# Patient Record
Sex: Female | Born: 2000 | Race: White | Hispanic: No | Marital: Single | Smoking: Never smoker
Health system: Southern US, Community
[De-identification: ages and names within clinical notes are randomized; demographics above are authoritative.]

## PROBLEM LIST (undated history)

## (undated) ENCOUNTER — Emergency Department (HOSPITAL_COMMUNITY): Payer: Self-pay

## (undated) DIAGNOSIS — B349 Viral infection, unspecified: Secondary | ICD-10-CM

## (undated) DIAGNOSIS — F17200 Nicotine dependence, unspecified, uncomplicated: Secondary | ICD-10-CM

## (undated) DIAGNOSIS — A749 Chlamydial infection, unspecified: Secondary | ICD-10-CM

## (undated) DIAGNOSIS — F99 Mental disorder, not otherwise specified: Secondary | ICD-10-CM

## (undated) DIAGNOSIS — A64 Unspecified sexually transmitted disease: Secondary | ICD-10-CM

## (undated) DIAGNOSIS — J302 Other seasonal allergic rhinitis: Secondary | ICD-10-CM

## (undated) DIAGNOSIS — F419 Anxiety disorder, unspecified: Secondary | ICD-10-CM

---
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
## 2019-01-10 ENCOUNTER — Encounter (HOSPITAL_COMMUNITY): Payer: Self-pay

## 2019-01-10 ENCOUNTER — Other Ambulatory Visit: Payer: Self-pay

## 2019-01-10 ENCOUNTER — Inpatient Hospital Stay (HOSPITAL_COMMUNITY): Payer: MEDICAID | Admitting: OBSTETRICS/GYNECOLOGY

## 2019-01-10 ENCOUNTER — Encounter (HOSPITAL_COMMUNITY): Payer: MEDICAID | Admitting: Student in an Organized Health Care Education/Training Program

## 2019-01-10 ENCOUNTER — Inpatient Hospital Stay
Admission: AD | Admit: 2019-01-10 | Discharge: 2019-01-11 | DRG: 806 | Disposition: A | Discharge status: Home / Self Care | Payer: MEDICAID | Attending: OBSTETRICS/GYNECOLOGY | Admitting: OBSTETRICS/GYNECOLOGY

## 2019-01-10 ENCOUNTER — Encounter (HOSPITAL_COMMUNITY): Payer: Self-pay | Admitting: Student in an Organized Health Care Education/Training Program

## 2019-01-10 DIAGNOSIS — O99334 Smoking (tobacco) complicating childbirth: Secondary | ICD-10-CM

## 2019-01-10 DIAGNOSIS — F17210 Nicotine dependence, cigarettes, uncomplicated: Secondary | ICD-10-CM | POA: Diagnosis present

## 2019-01-10 DIAGNOSIS — O99344 Other mental disorders complicating childbirth: Secondary | ICD-10-CM | POA: Diagnosis present

## 2019-01-10 DIAGNOSIS — O99343 Other mental disorders complicating pregnancy, third trimester: Secondary | ICD-10-CM

## 2019-01-10 DIAGNOSIS — O99333 Smoking (tobacco) complicating pregnancy, third trimester: Secondary | ICD-10-CM

## 2019-01-10 DIAGNOSIS — F419 Anxiety disorder, unspecified: Secondary | ICD-10-CM | POA: Diagnosis present

## 2019-01-10 DIAGNOSIS — O9882 Other maternal infectious and parasitic diseases complicating childbirth: Secondary | ICD-10-CM

## 2019-01-10 DIAGNOSIS — A568 Sexually transmitted chlamydial infection of other sites: Secondary | ICD-10-CM

## 2019-01-10 DIAGNOSIS — A749 Chlamydial infection, unspecified: Secondary | ICD-10-CM | POA: Diagnosis present

## 2019-01-10 DIAGNOSIS — Z3A39 39 weeks gestation of pregnancy: Secondary | ICD-10-CM

## 2019-01-10 DIAGNOSIS — N644 Mastodynia: Secondary | ICD-10-CM | POA: Diagnosis not present

## 2019-01-10 DIAGNOSIS — Z23 Encounter for immunization: Secondary | ICD-10-CM

## 2019-01-10 DIAGNOSIS — Z3490 Encounter for supervision of normal pregnancy, unspecified, unspecified trimester: Secondary | ICD-10-CM | POA: Diagnosis present

## 2019-01-10 HISTORY — DX: Anxiety disorder, unspecified: F41.9

## 2019-01-10 HISTORY — DX: Chlamydial infection, unspecified: A74.9

## 2019-01-10 HISTORY — DX: Viral infection, unspecified: B34.9

## 2019-01-10 HISTORY — DX: Mental disorder, not otherwise specified: F99

## 2019-01-10 HISTORY — DX: Unspecified sexually transmitted disease: A64

## 2019-01-10 HISTORY — DX: Nicotine dependence, unspecified, uncomplicated: F17.200

## 2019-01-10 HISTORY — DX: Other seasonal allergic rhinitis: J30.2

## 2019-01-10 LAB — DRUG SCREEN, NO CONFIRMATION, URINE
AMPHETAMINES URINE: NEGATIVE
BARBITURATES URINE: NEGATIVE
BENZODIAZEPINES URINE: NEGATIVE
BUPRENORPHINE URINE: NEGATIVE
CANNABINOIDS URINE: NEGATIVE
COCAINE METABOLITES URINE: NEGATIVE
CREATININE RANDOM URINE: 57 mg/dL
ECSTASY/MDMA URINE: NEGATIVE
FENTANYL, RANDOM URINE: NEGATIVE
METHADONE URINE: NEGATIVE
OPIATES URINE (LOW CUTOFF): NEGATIVE
OXIDANT-ADULTERATION: NEGATIVE
OXYCODONE URINE: NEGATIVE
PH-ADULTERATION: 7.1 (ref 4.5–9.0)
SPECIFIC GRAVITY-ADULTERATION: 1.012 g/mL (ref 1.005–1.030)

## 2019-01-10 LAB — CBC WITH DIFF
BASOPHIL #: <0.1 10*3/uL (ref <=0.20)
BASOPHIL %: 0 %
EOSINOPHIL #: <0.1 10*3/uL (ref <=0.50)
EOSINOPHIL %: 0 %
HCT: 36.9 % (ref 34.8–46.0)
HGB: 12.2 g/dL (ref 11.5–16.0)
IMMATURE GRANULOCYTE #: <0.1 10*3/uL (ref <0.10)
IMMATURE GRANULOCYTE %: 0 % (ref 0–1)
LYMPHOCYTE #: 2.45 10*3/uL (ref 1.00–4.80)
LYMPHOCYTE %: 18 %
MCH: 29.4 pg (ref 26.0–32.0)
MCHC: 33.1 g/dL (ref 31.0–35.5)
MCV: 88.9 fL (ref 78.0–100.0)
MONOCYTE #: 0.84 10*3/uL (ref 0.20–1.10)
MONOCYTE %: 6 %
MPV: 11.8 fL (ref 8.7–12.5)
NEUTROPHIL #: 9.88 10*3/uL — ABNORMAL HIGH (ref 1.50–7.70)
NEUTROPHIL %: 76 %
PLATELETS: 219 10*3/uL (ref 150–400)
RBC: 4.15 10*6/uL (ref 3.85–5.22)
RDW-CV: 14.4 % (ref 11.5–15.5)
WBC: 13.3 10*3/uL — ABNORMAL HIGH (ref 3.7–11.0)

## 2019-01-10 LAB — CHLAMYDIA AND NEISSERIA DNA BY PCR
CHLAMYDIA TRACHOMATIS PCR: DETECTED — AB
NEISSERIA GONORRHOEAE PCR: NOT DETECTED

## 2019-01-10 LAB — TYPE AND SCREEN
ABO/RH(D): A POS
ANTIBODY SCREEN: NEGATIVE

## 2019-01-10 LAB — H & H
HCT: 36.9 % (ref 34.8–46.0)
HGB: 12.2 g/dL (ref 11.5–16.0)

## 2019-01-10 MED ORDER — OXYTOCIN 30 UNIT/500 ML IN 0.9 % SODIUM CHLORIDE INTRAVENOUS
334.0000 m[IU]/min | INTRAVENOUS | Status: AC
Start: 2019-01-10 — End: 2019-01-10
  Administered 2019-01-10: 95 m[IU]/min via INTRAVENOUS

## 2019-01-10 MED ORDER — OXYTOCIN 30 UNIT/500 ML IN 0.9 % SODIUM CHLORIDE INTRAVENOUS
INTRAVENOUS | Status: AC
Start: 2019-01-10 — End: 2019-01-10
  Administered 2019-01-10: 30 [IU] via INTRAVENOUS
  Filled 2019-01-10: qty 500

## 2019-01-10 MED ORDER — LIDOCAINE HCL 10 MG/ML (1 %) INJECTION SOLUTION
Freq: Once | INTRAMUSCULAR | Status: DC | PRN
Start: 2019-01-10 — End: 2019-01-11
  Administered 2019-01-10: 3 mL

## 2019-01-10 MED ORDER — ACETAMINOPHEN 325 MG TABLET
650.0000 mg | ORAL_TABLET | ORAL | Status: DC | PRN
Start: 2019-01-10 — End: 2019-01-11

## 2019-01-10 MED ORDER — SODIUM CHLORIDE 0.9 % (FLUSH) INJECTION SYRINGE
2.0000 mL | INJECTION | Freq: Three times a day (TID) | INTRAMUSCULAR | Status: DC
Start: 2019-01-10 — End: 2019-01-10

## 2019-01-10 MED ORDER — MINERAL OIL ORAL
TOPICAL_OIL | ORAL | Status: AC
Start: 2019-01-10 — End: 2019-01-10
  Administered 2019-01-10: 30 mL
  Filled 2019-01-10: qty 60

## 2019-01-10 MED ORDER — ROPIVACAINE (PF) 2 MG/ML (0.2 %) INJECTION SOLUTION
Freq: Once | INTRAMUSCULAR | Status: DC | PRN
Start: 2019-01-10 — End: 2019-01-11
  Administered 2019-01-10: 5 mL

## 2019-01-10 MED ORDER — AZITHROMYCIN 500 MG INTRAVENOUS SOLUTION
500.0000 mg | INTRAVENOUS | Status: DC
Start: 2019-01-10 — End: 2019-01-10
  Administered 2019-01-10: 14:00:00
  Filled 2019-01-10: qty 5

## 2019-01-10 MED ORDER — IBUPROFEN 600 MG TABLET
600.0000 mg | ORAL_TABLET | Freq: Four times a day (QID) | ORAL | Status: DC
Start: 2019-01-10 — End: 2019-01-11
  Administered 2019-01-10 – 2019-01-11 (×2): 0 mg via ORAL
  Administered 2019-01-11: 18:00:00
  Administered 2019-01-11 (×2): 0 mg via ORAL

## 2019-01-10 MED ORDER — SODIUM CHLORIDE 0.9 % (FLUSH) INJECTION SYRINGE
2.00 mL | INJECTION | Freq: Three times a day (TID) | INTRAMUSCULAR | Status: DC
Start: 2019-01-10 — End: 2019-01-11
  Administered 2019-01-10 – 2019-01-11 (×4): 0 mL

## 2019-01-10 MED ORDER — FENTANYL 2MCG/ML + ROPIVACAINE 0.2% IN NS 250ML (TOT VOL) PCEA INFUSION
INJECTION | Status: DC
Start: 2019-01-10 — End: 2019-01-10
  Administered 2019-01-10: 10 mL/h via EPIDURAL
  Administered 2019-01-10: 16:00:00 0 mL/h via EPIDURAL
  Filled 2019-01-10: qty 250

## 2019-01-10 MED ORDER — SODIUM CHLORIDE 0.9 % (FLUSH) INJECTION SYRINGE
2.0000 mL | INJECTION | INTRAMUSCULAR | Status: DC | PRN
Start: 2019-01-10 — End: 2019-01-10

## 2019-01-10 MED ORDER — LACTATED RINGERS INTRAVENOUS SOLUTION
INTRAVENOUS | Status: DC
Start: 2019-01-10 — End: 2019-01-11
  Administered 2019-01-10: 10:00:00 via INTRAVENOUS

## 2019-01-10 MED ORDER — DOCUSATE SODIUM 100 MG CAPSULE
100.0000 mg | ORAL_CAPSULE | Freq: Two times a day (BID) | ORAL | Status: DC | PRN
Start: 2019-01-10 — End: 2019-01-11

## 2019-01-10 MED ORDER — SODIUM CHLORIDE 0.9 % (FLUSH) INJECTION SYRINGE
2.00 mL | INJECTION | INTRAMUSCULAR | Status: DC | PRN
Start: 2019-01-10 — End: 2019-01-11

## 2019-01-10 MED ORDER — LIDOCAINE-EPINEPHRINE (PF) 1.5 %-1:200,000 INJECTION SOLUTION
Freq: Once | INTRAMUSCULAR | Status: DC | PRN
Start: 2019-01-10 — End: 2019-01-11
  Administered 2019-01-10: 3 mL via EPIDURAL

## 2019-01-10 NOTE — Anesthesia Procedure Notes (Signed)
Lumbar Epidural   Performed by:    Performing Provider:  Philmore Pali, MD  Authorizing Provider:  Tollie Pizza, MD    Indication: pain relief in labor and delivery        Pt location: At bedside  Technique: ( See MAR for exact doses)  Technique/Approach: midline        Needle Level: L3-4  Sterile Skin Prep : aseptic technique, cap, hand hygiene performed, sterile gloves, mask, sterilely prepped and draped and sterile field established     Site verified, H&P updated and consent obtained, Patient monitors applied, Timeout performed, Emergency drugs and equipment available, Patient positioned and anesthesia consent given   Patient position: sitting   Skin local: Lidocaine 1%   Needle/Catheter: Needle type: Hustead   Needle Gauge: 18 G  Needle length: 3.5 in  Epidural Injection Technique LOR saline  Needle insertion depth 7 cm Catheter length in space: 5 cm   Catheter at skin depth: 12 cm  Epidural catheter location: lumbar (1-5)  Number of attempts: 1  Events: neg aspiration and no complications,         Dosing:       lidocaine 1.5% with epinephrine 1:200,000       Negative test - not INTRAVENOUS  and Negative test - not Subarachnoid   Injection made incrementally with aspirations every 42mL .  Catheter: secured and sterile dressing applied         Patient response: comfortable  NOTES: Philmore Pali, MD  Department of Anesthesiology  [phone]

## 2019-01-10 NOTE — Nurses Notes (Signed)
Patient transferred to room 603 via bed.

## 2019-01-10 NOTE — Progress Notes (Signed)
[facility]      Labor Progress Note    PATIENT:  Erika Greene   MRN:  [id number]   DATE OF SERVICE:  01/10/2019, 11:42     S:  Patient resting comfortably in bed.     O:    Filed Vitals:    01/10/19 1110 01/10/19 1120 01/10/19 1132 01/10/19 1140   BP: 138/82 124/68 118/66 122/72   Pulse: 90 75 (!) 102 100   Temp:       SpO2:           SVE:  8/100/0    FHT:  145 baseline, moderate variability, + accels, - decels  Toco:  Q2-49m    A/P:  18 y.o. G1P0 @ [redacted]w[redacted]d    1.  Labor   SVE changed from prior exam - 8/100/0   EFM Cat 1, reassuring; ctx q2-44m   Analgesia:    -epidural in place    GBS neg    Anticipate SVD.     Carolanne Grumbling, DO 01/10/2019 12:29  PGY-1  [facility]  Department of Obstetrics & Gynecology            I saw and examined the patient.  I reviewed the resident's note.  I agree with the findings and plan of care as documented in the resident's note.  Any exceptions/additions are edited/noted.    Holley Bouche, MD

## 2019-01-10 NOTE — Anesthesia Preprocedure Evaluation (Addendum)
ANESTHESIA PRE-OP EVALUATION  Planned Procedure: ANES - LABOR ANALGESIA  Review of Systems     anesthesia history negative               Pulmonary   Smoking history,   Cardiovascular    No peripheral edema,  Exercise Tolerance: > or = 4 METS        GI/Hepatic/Renal   negative GI/hepatic/renal ROS,      Endo/Other   neg endo/other ROS,        Neuro/Psych/MS    anxiety     Cancer  negative hematology/oncology ROS,                    Physical Assessment      Airway       Mallampati: II    TM distance: >3 FB    Neck ROM: full  Mouth Opening: good.      No endotracheal tube present  No Tracheostomy present    Dental       Dentition intact             Pulmonary    Breath sounds clear to auscultation  (-) no rhonchi, no decreased breath sounds, no wheezes, no rales and no stridor     Cardiovascular    Rhythm: regular  Rate: Normal  (-) no friction rub, carotid bruit is not present, no peripheral edema and no murmur     Other findings            Plan  ASA 2     Planned anesthesia type: epidural                  Anesthesia issues/risks discussed are: Spinal Headache, Failure of Block, Cardiac Events/MI, Local Anesthetic Systemic Toxicity and High Neuraxial Block.  Anesthetic plan and risks discussed with patient.            NPO Status: Full stomach precautions.                        Results for EVALIE, HARGRAVES (MRN [id number]) as of 01/10/2019 10:34   Ref. Range 01/10/2019 10:05   WBC Latest Ref Range: 3.7 - 11.0 x10^3/uL 13.3 (H)   HGB Latest Ref Range: 11.5 - 16.0 g/dL 12.2   HCT Latest Ref Range: 34.8 - 46.0 % 36.9   PLATELET COUNT Latest Ref Range: 150 - 400 x10^3/uL 219     Risks and benefits of epidural discussed including headache, nerve injuries, hypotension, and failure of block. Patient understands, all questions answered, will proceed with epidural.     Philmore Pali, MD  01/10/2019, 10:35        I saw and examined the patient.  I reviewed the resident's note.  I agree with the findings and plan of care as documented in  the resident's note.  Any exceptions/additions are edited/noted.    97DZ G1 with uncomplicated pregnancy, h/o anxiety, recently moved from [place] presenting in labor, requesting epidural for labor pain relief.  NPO appropriate.  ASA2 for epidural labor analgesia with PCEA.    Tollie Pizza, MD

## 2019-01-10 NOTE — H&P (Signed)
[facility]      Obstetrics History and Physical    PATIENT:  Erika Greene   MRN:  [id number]   DATE OF SERVICE:  01/10/2019, 10:35      PRIMARY OB:  Jorja Loa, Rod MD     CC:  Contractions 2-3 mins apart     HPI:  Erika Greene is a 18 y.o. G1P0 at 42w0dper paper record consistent with LMP who presents to labor and delivery for contractions 2-3 minutes apart.  Pt stated yesterday she went to her OBGYN's office and she was 4 cm dilated.  She woke at 7 am this morning with pain in her back and the pain transitioned to her pelvic area.  She states the pain feels like pressure, squeezing and contractions and are spaced out 2-3 minutes.  She has experienced no gush of fluid or blood from vagina. Her paper records state the baby is cephalic, which is consistent with physical exam. [place] from her early pregnancy.  She follows with Dr HJorja Loasince May.  She was diagnosed with chlamydia in early pregnancy, it is unknown if she completed a full course of antibiotic therapy for that infection - GC/CC pending.  No evidence of HTN or DM per records. Rubella and Hep B non-immune.  PT has smoked tobacco throughout duration of pregnancy. Patient has migraines without aura anxiety and has not taken medications for many years. She has been taking her iron and prenatal vitamins, she has not been taking her prescribed mag. She desires an epidural for labor.  Father of baby will come to L&D floor after working today at 6 pm.     ROD:  Dating Summary     No data available           OBHx:  Lab Results   Component Value Date    HGB 12.2 01/10/2019    HGB 12.2 01/10/2019    HCT 36.9 01/10/2019    HCT 36.9 01/10/2019     Rubella:  Non-immune    HIV:  negative    VDRL:  negative     Hep B SAG:  Non-immune     GC:  Pending; chlamydia early preganncy     GBS:  -    OB History   Gravida Para Term Preterm AB Living   1             SAB TAB Ectopic  Multiple Live Births                  # Outcome Date GA Lbr Len/2nd Weight Sex Delivery Anes PTL Lv   1 Current                PMHx:  Past Medical History:   Diagnosis Date   . Anxiety    . Chlamydia    . Psychiatric problem    . STD (sexually transmitted disease)    . Tobacco dependence    . Viral illness       Anxiety - not on medication  Tobacco use disorder; smoked throughout pregnancy     Past Medical History was reviewed and is negative for diabetes, htn      PSHx:  Past Surgical History was reviewed and is negative for all surgeries        FamHx:   Denies FH of BD, MR, SZs or bleeding/clotting disorders  Family history of heart disease -  DM2, MI  Family Medical History:     Problem Relation (Age of Onset)    Heart Disease Father              SocHx:  Denies drug use  Currently smokes   Social History     Tobacco Use   . Smoking status: Current Every Day Smoker     Types: Cigarettes   Substance Use Topics   . Alcohol use: Not Currently     Frequency: Never   . Drug use: Not Currently        CURRENT MEDS:   Medications Prior to Admission     Prescriptions    ferrous sulfate (FERATAB) 324 mg (65 mg iron) Oral Tablet, Delayed Release (E.C.)    Take 324 mg by mouth    prenatal vitamin-iron-folate Tablet    Take 1 Tab by mouth Once a day           ALLERGIES:  Nutritional supplement-fiber   Eggs - throat swelling     REVIEW OF SYSTEMS: Other than ROS in the HPI, all other systems were negative.    PHYSICAL EXAMINATION:   Filed Vitals:    01/10/19 0920 01/10/19 0934   BP: 130/89    Pulse: 85    Temp:  36.4 C (97.5 F)     Vitals: stable  GEN:  moderately obese, appears stated age and severe distress, normal mood and affect  HEENT:  EOMI, CN II-XII grossly intact  CV:  regular rate and rhythm, S1, S2 normal, no murmur, click, rub or gallop  RESP:  Clear to auscultation bilaterally.   ABD:  Soft, non-tender, Bowel sounds normal; gravid  EXT:  No cyanosis or edema  NEURO:  2+ reflexes bilateral patellar  SKIN:  No  apparent lesions, no rash    SVE:  5/90/-1 at 10:00  SSE:  Bulging sac appreciated   PRESENTATION:  Cephalic by ultrasound  EFW:   Unknown     FHRT:  135 baseline, moderate variability, + accels, - decels  TOCO:  Contractions present 2-3 mins     ASSESSMENT/PLAN:  18 y.o. G1P0 at [redacted]W[redacted]D by LMP per paper chart     PNC   PNL WNL   Continue PNV    1.  G1P0 [redacted]W[redacted]D w Contractions at 5/90/-1 at 10:00 AM   CBC ordered; desires epidural at this time   IV access, LR 75 cc/hr   Vitals   Fetal monitoring; continuous    Expectant management for SVD   GBS negative per paper record    urine drug screen pending    2.  Chlamydia in Early Pregnancy    Repeat GC/CC pending   Unreliable records and prenatal care; moved from [place] with no records    3. Anxiety   2 week follow up mood check    4. Rubella non-immune   Will need MMR    5. HBV non-immune   Consider Hep B series post delivery      6. Tobacco use in Pregnancy   Smoking cessation coaching        Jacklynn Lewis, MD  PGY-1        I saw and examined the patient.  I reviewed the resident's note.  I agree with the findings and plan of care as documented in the resident's note.  Any exceptions/additions are edited/noted.    Holley Bouche, MD

## 2019-01-10 NOTE — Care Plan (Signed)
Erika Greene is 18yo G1 now P1 that had a SVD of a viable baby boy that is rooming in with her. Labor, delivery and recovery uncomplicated. Breastfeeding her baby boy. Positive bonding observed.

## 2019-01-10 NOTE — Nurses Notes (Signed)
Recovery completed, assisted up to bathroom with help of sara steady, voided then pericare instructions/demo given. Ambulated back to bed with steady gait. Instructed to not get up with out assistance, verbalized understanding

## 2019-01-10 NOTE — Nurses Notes (Signed)
Report to Lebron Conners, RN

## 2019-01-11 DIAGNOSIS — O99335 Smoking (tobacco) complicating the puerperium: Secondary | ICD-10-CM

## 2019-01-11 DIAGNOSIS — O99345 Other mental disorders complicating the puerperium: Secondary | ICD-10-CM

## 2019-01-11 MED ORDER — AZITHROMYCIN 500 MG TABLET
1000.0000 mg | ORAL_TABLET | Freq: Every day | ORAL | 0 refills | Status: DC
Start: 2019-01-11 — End: 2019-01-11

## 2019-01-11 MED ORDER — AZITHROMYCIN 500 MG TABLET
1000.0000 mg | ORAL_TABLET | Freq: Every day | ORAL | 0 refills | Status: AC
Start: 2019-01-11 — End: ?

## 2019-01-11 MED ORDER — PSYLLIUM ORAL PACKET - CUSTOM
1.00 | PACK | Freq: Two times a day (BID) | 0 refills | Status: AC
Start: 2019-01-11 — End: 2019-02-10

## 2019-01-11 MED ORDER — MEASLES,MUMPS,RUBELLA VACCINE LIVE(PF)1,000-12,500TCID50/0.5 ML SUBCUT
0.5000 mL | Freq: Once | SUBCUTANEOUS | Status: AC
Start: 2019-01-11 — End: 2019-01-11
  Administered 2019-01-11: 0.5 mL via SUBCUTANEOUS
  Filled 2019-01-11: qty 0.5

## 2019-01-11 MED ORDER — MEASLES,MUMPS,RUBELLA VACCINE LIVE(PF)1,000-12,500TCID50/0.5 ML SUBCUT
0.5000 mL | Freq: Once | SUBCUTANEOUS | 0 refills | Status: AC
Start: 2019-01-11 — End: 2019-01-11

## 2019-01-11 MED ORDER — AZITHROMYCIN 250 MG TABLET
1000.0000 mg | ORAL_TABLET | Freq: Once | ORAL | Status: AC
Start: 2019-01-11 — End: 2019-01-11
  Administered 2019-01-11: 08:00:00 1000 mg via ORAL
  Filled 2019-01-11: qty 4
  Filled 2019-01-11: qty 2

## 2019-01-11 MED ORDER — IBUPROFEN 600 MG TABLET
600.00 mg | ORAL_TABLET | Freq: Four times a day (QID) | ORAL | 2 refills | Status: AC
Start: 2019-01-11 — End: ?

## 2019-01-11 MED ORDER — FERROUS SULFATE 324 MG (65 MG IRON) TABLET,DELAYED RELEASE
324.00 mg | DELAYED_RELEASE_TABLET | ORAL | 2 refills | Status: AC
Start: 2019-01-11 — End: ?

## 2019-01-11 MED ORDER — AZITHROMYCIN 250 MG TABLET
1000.0000 mg | ORAL_TABLET | Freq: Every day | ORAL | Status: DC
Start: 2019-01-11 — End: 2019-01-11

## 2019-01-11 NOTE — Care Management Notes (Signed)
[facility] Management Initial Evaluation    Patient Name: Erika Greene  Date of Birth: March 08, 2001  Sex: female  Date/Time of Admission: 01/10/2019  9:13 AM  Room/Bed: 620/A  Payor: /   Primary Care Providers:  Erlene Quan, MD, MD (OB/GYN)    Pharmacy Info:   [facility], [place]    [place] [address]    Phone: [phone] Fax: [phone]    Not a 24 hour pharmacy; exact hours not known.        Emergency Contact Info:   No emergency contact information on file.    History:   Erika Greene is a 18 y.o., female, admitted for labor.     Height/Weight: 160 cm ('5\' 3"'$ ) / 86.6 kg (191 lb)     LOS: 1 day   Admitting Diagnosis: Pre-birth visit for expectant mother [Z34.90]    Assessment:      01/11/19 1300   Assessment Details   Assessment Type Admission   Date of Care Management Update 01/11/19   Date of Next DCP Update 01/14/19   Readmission   Is this a readmission? No   Care Management Plan   Discharge Planning Status initial meeting   Projected Discharge Date 01/11/19   Discharge plan discussed with: Patient   Discharge Needs Assessment   Outpatient/Agency/Support Group Needs   (none)   Equipment Currently Used at Home breast pump   Equipment Needed After Discharge none   Discharge Facility/Level of Care Needs Home (Patient/Family Member/other)(code 1)   Transportation Available car;family or friend will provide   Referral Information   Admission Type inpatient   Address Verified verified-no changes   Arrived From home or self-care   Insurance Verified verified-no change   ADVANCE DIRECTIVES   Does the Patient have an Advance Directive? No, Information Offered and Refused   Patient Requests Assistance in Having Advance Directive Notarized. N/A   LAY CAREGIVER    Appointed Lay Caregiver? I Decline   Employment/Financial   Patient has Prescription Coverage?  Yes        Name of Insurance Coverage for Medications [facility]      Financial Concerns none   Living Environment   Select an age group to open "lives with" row.  Adult   Lives With significant other;other (see comments)   Living Arrangements house   Able to Return to Prior Arrangements yes   Living Arrangement Comments resides with s/o and his grandmother   Home Safety   Home Assessment: No Problems Identified   Home Accessibility no concerns     Pt is an 18 year old G1 now P1 delivered at 83w2dgestation. Viable infant boy delivered via SVD on 01/10/19 at 1544, weighing 7 lbs 1.1 oz with Apgars of 8 and 9. Pt is bottle feeding and pumping and infant is rooming in. Infant to be named Erika Greene.   MSW met with pt at bedside to complete assessment. Pt resides with s/o- DG[place]y Clementand his grandmother [place] [address] [place]([phone]. 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 Pt reports having all necessary items for infant such as crib, car seat, etc. Car seat to be brought to room. Pt has breast pump at home that she obtained through insurance. [facility]  [facility] to provide transportation at d/c. Pt report she and s/o moved here from [place] to be closer to family. Her family resides in [place]. No d/c needs identified at this time. Anticipate d/c to home. Will follow.       Discharge Plan:  Home (Patient/Family Member/other) (code 1)  Pt s/p SVD. Pt likely to d/c 24 hours after delivery if there are no complications.     The patient will continue to be evaluated for developing discharge needs.     Case Manager: Lanae Crumbly, [place]  Phone: [phone]

## 2019-01-11 NOTE — Progress Notes (Signed)
[facility]      Postpartum Vaginal Delivery Progress Note    PATIENT:  Erika Greene   MRN:  [id number]   DATE OF SERVICE:  01/11/2019, 06:06      SUBJECTIVE:  Erika Greene is a 18 y.o. G1P1001 PPD #1 s/p SVD at 1544 over intact perineum. Pt doing well with no complaints voiced at this time.  Ambulating and voiding without difficulty.  Tolerating regular  diet with no N/V.  - flatus, - BM.  Reports 7/10 cramping pain but feels it is insignificant because she needs to take care of her baby and make sure he doesn't cry. Denies CP, SOB, dizziness or calf tenderness.  Minimal lochia reported. Breast feeding but reports significant pain with latching.  Circ desired.      OBJECTIVE:   Filed Vitals:    01/10/19 1807 01/10/19 1808 01/11/19 0051 01/11/19 0418   BP: 131/77  (!) 143/88 (!) 145/73   Pulse: 91      Resp:  _0 Temp:  36.4 C (97.5 F) 37.1 C (98.8 F) 37.2 C (99 F)   SpO2:    98%       GENERAL:  NAD, well-appearing  CV:  RRR, no m/r/g  RESP:  CTAB  ABD:  Soft, non-distended, appropriately tender  FUNDUS:  Firm _1 -1  EXT:  No calf tenderness, scant edema BLE      ASSESSMENT/PLAN:  18 y.o. G1P1001 PPD #1 s/p SVD at 1544    1.  Postpartum Care: Ambulating, urinating, tolerating POs, pain well-controlled  Infant Care/Feeding: breast - reports pain with latching, per patient lactation will be seeing her this morning  Contraception: POPs desired, discussed IUD, nexplanon, depo and patient refused all.  A POSITIVE: Rhogam not indicated  Rubella: non-immune, MMR- MMR necessary  H&H: 12.2  Vitals: 2 mild range Bps over night   UO: adequate    Chlamydia in Early Pregnancy                Positive for chlamydia 8/14    Counseled patient on importance of partner also being treated and abstaining from intercourse for 7 days after treatment is completed    Will send RX for home for partner                Unreliable records and prenatal care; moved from [place] with no  records    Anxiety               2 week follow up mood check    Rubella non-immune               Will need MMR    HBV non-immune               Consider Hep B series post delivery     Tobacco use in Pregnancy               Smoking cessation coaching     DISPO:  Anticipate d/c today    Carolanne Grumbling, DO 01/11/2019 06:10  PGY-1  [facility]  Department of Obstetrics & Gynecology    Care was provided by [facility] service.  I was immediately available but not needed for consultation at this time.    Becky Sax, MD  MFM Attending

## 2019-01-11 NOTE — Discharge Summary (Signed)
DISCHARGE SUMMARY      PATIENT NAME:  Erika Greene   MRN: [id number]   DOB:  11-Feb-2001     ADMISSION DATE:  01/10/2019    DISCHARGE DATE:  01/11/2019     ATTENDING PHYSICIAN: Lillia Mountain, MD  PRIMARY CARE PHYSICIAN: No primary care provider on file.     CHIEF COMPLAINT:    Chief Complaint   Patient presents with   . Contractions       DISCHARGE DIAGNOSIS: 18 y.o. G1P1001 sp SVD  Principle Problem:   Patient Active Problem List    Diagnosis   . Pre-birth visit for expectant mother       DISCHARGE MEDICATIONS:     Current Discharge Medication List      START taking these medications.      Details   azithromycin 500 mg Tablet  Commonly known as:  ZITHROMAX   1,000 mg, Oral, DAILY  Qty:  1 Tab  Refills:  0     Ibuprofen 600 mg Tablet  Commonly known as:  MOTRIN   600 mg, Oral, EVERY 6 HOURS  Qty:  60 Tab  Refills:  2     measles,mumps & rubella(PF) 1,000-12,500 TCID50/0.5 mL Recon Soln  Commonly known as:  M-M-R II   0.5 mL, Subcutaneous, ONCE  Qty:  0.5 mL  Refills:  0     psyllium Packet  Commonly known as:  METAMUCIL   1 Packet, Oral, 2 TIMES DAILY  Qty:  60 Packet  Refills:  0        CONTINUE these medications which have CHANGED during your visit.      Details   ferrous sulfate 324 mg (65 mg iron) Tablet, Delayed Release (E.C.)  Commonly known as:  FERATAB  What changed:  when to take this   324 mg, Oral, EVERY OTHER DAY  Qty:  15 Tab  Refills:  2        CONTINUE these medications - NO CHANGES were made during your visit.      Details   prenatal vitamin-iron-folate Tablet   1 Tab, Oral, DAILY  Refills:  0            DISCHARGE INSTRUCTIONS:   No discharge procedures on file.     REASON FOR HOSPITALIZATION AND HOSPITAL COURSE:  This is a 18 y.o., female now [id number] who was admitted on 01/10/2019 for contractions. She underwent SVD of viable 3205g female on 01/10/2019 @ 1544, APGARs 8/9.   Her postpartum course was uncomplicated, andshe met postpartum milestones appropriately. Rhogam unnecessary, MMR administered.  She  is breast feeding and desires no forms of birth control for contraception. We have discussed all forms of birth control, with benefits and risks, however, patient thinks she will just practice abstinence to avoid pregnancy. Patient discharged to home today in stable condition with instructions on postpartum care and to followup with Dr. Jorja Loa in clinic in 2 and 6 weeks. Pt understands and agrees with plan.     CONDITION ON DISCHARGE:  A.Ambulation: Full ambulation  B. Self-care Ability: Complete  C. Cognitive Status Alert and Oriented x 3    DISCHARGE DISPOSITION:  Home discharge     cc: Primary Care Physician:  No primary provider on file.     XN:[id number] Physician:  No referring provider defined for this encounter.  Referring providers can utilize [email or web address] to access their referred [facility] patient's information.    Jacklynn Lewis, MD  PGY-1      I  reviewed the resident's note.  I agree with the findings and plan of care as documented in the resident's note.  Any exceptions/additions are edited/noted.    Lillia Mountain, MD

## 2019-01-11 NOTE — Progress Notes (Signed)
[facility]   [place] POSTPARTUM PROGRESS NOTE:  CNM      Beckett, Hickmon, 18 y.o. female  Date of Admission:  01/10/2019  Date of Service: 01/11/2019  Date of Birth:  [birth year]    Hospital Day:  LOS: 1 day     Chief Complaint: S/p NSVD    Subjective: Patient reports nipple pain with latching.  Ambulating without assist or dizziness.  Eating with good appetite.  No issues with urination.  No BM.  Bleeding similar to menses, no clots.  Pain well controlled with meds as written.      Baby Feeding Method: Breast    Vital Signs:  Temp (24hrs) Max:37.2 C (99 F)    Temperature: 36.8 C (98.2 F)  BP (Non-Invasive): (!) 147/96(RN notified)  MAP (Non-Invasive): 108 mmHG  Heart Rate: 80  Respiratory Rate: 20  Pain Score (Numeric, Faces): 0  SpO2: 98 %    Rh: Positive, Rubella:Immune, CHY:[id number]     Today's Physical Exam:  General Appearance: Alert, appropriate appearance for age. No acute distress  Heart: Appears well perfused   Lungs: Easy, unlabored breathing  Breasts: Soft, filling. Nipple intact.  Abdomen: Soft, non tender  Fundus: Firm, midline, non tender, @ U  Perineum/Lochia: Small, rubra lochia, no clots, no odor  Mood: States well    Current Medications:  acetaminophen (TYLENOL) tablet, 650 mg, Oral, Q4H PRN  docusate sodium (COLACE) capsule, 100 mg, Oral, 2x/day PRN  ibuprofen (MOTRIN) tablet, 600 mg, Oral, Q6H  LR premix infusion, , Intravenous, Continuous  measles, mumps & rubella vaccine (M-M-R II) SubQ injection, 0.5 mL, Subcutaneous, Once  NS flush syringe, 2 mL, Intracatheter, Q8HRS    And  NS flush syringe, 2-6 mL, Intracatheter, Q1 MIN PRN        Assessment:  18 year old s/p NSVD over intact perineum  Teen pregnancy  Breastfeeding  Plans POPs and possibly IUD for contraception    Plan:  Postpartum teaching initiated.  Support breastfeeding.  Encourage rest.  Discussed limitations of POPs.  Discussed IUD options postpartum.  Anticipate discharge tomorrow if continued stable.  Follow up 6  weeks with Dr. Jorja Loa or RTO sooner prn.    Kayla Pomp-Steurer, [place] was provided by [facility] service.  I was immediately available but not needed for consultation at this time.    Becky Sax, MD  MFM Attending

## 2019-01-11 NOTE — Discharge Instructions (Signed)
O.B. INSTRUCTIONS       Call [phone] and ask for the [facility] physician on call for any of the following symptoms:     Fever of 100.2 or higher for 2 or more hours.   Red, warm, painful area in either breast.   Urgency, frequency, burning, pain on urination or urinating small amounts.    Warm, tender area on either leg.   Purulent discharge from incision or vagina.   Feelings of depression, irritability, or anxiety.    Patient Education   Patient Education     Understanding Postpartum Depression  You've just had a baby. You expected to be excited and happy. But instead you find yourself crying for no reason. You may have trouble coping with your daily tasks. You feel sad, tired, and hopeless most of the time. You may even feel ashamed or guilty. But what you're going through is not your fault and you can feel better. Talk to your healthcare provider. He or she can help.    What is depression?  Depression is a mood disorder that affects the way you think and feel. The most common symptom is a feeling of deep sadness. You may also feel as if you just can't cope with life. Other symptoms include:   Gaining or losing a lot of weight   Sleeping too much or too little   Feeling tired all the time   Feeling restless   Crying a lot   Having too little or too much appetite.   Withdrawing from friends and family   Having headaches, aches and pains, or stomach problems that won't go away.   Fears of harming your baby   Lack of interest in your baby   Feeling worthless or guilty   No longer finding pleasure in things you used to   Having trouble thinking clearly or making decisions   Thinking about death or suicide  Depression after childbirth  You may be weepy and tired right after giving birth. These feelings are normal. They're sometimes called the "baby blues." These blues go away after 1 to 2 weeks. However, postpartum (meaning "after birth") depression lasts much longer and is more severe than the  "baby blues." It can make you feel sad and hopeless. You may also fear that your baby will be harmed and worry about being a bad mother.  What causes postpartum depression?  The exact cause of postpartum depression is unknown. Changes in brain chemistry or structure are believed to play a big role in depression.It may be due to changes in your hormones during and after childbirth. You may also be tired from caring for your baby and adjusting to being a mother. All these factors may make you feel depressed. In some cases, your genes may also play a role.  Depression can be treated  There are many ways to treat postpartum depression. Talking to your healthcare provider is the first step toward feeling better.  When to call your healthcare provider  Call your healthcare provider if you:   Cry for no clear reason   Have trouble sleeping, eating, and making choices   Questions whether you can handle caring for a baby   Have intense feelings of sadness, anxiety, or despair that prevent you from being able to do your daily tasks  Resources   [employer] Mental Health866-615-6464www.nimh.nih.gov   [employer] on Mental Illness800-950-6264www.nami.org   Mental Health America800-969-6642www.nmha.org   National Suicide Hotline800-(930)666-5343 (800-SUICIDE)    StayWell  last reviewed this educational content on 11/28/2015   2000-2020 [employer] [place] [address], [place], [place] [id number]. All rights reserved. This information is not intended as a substitute for professional medical care. Always follow your healthcare professional's instructions.           After a Vaginal Birth    After having a baby, your body may be very tired. It can take time to recover from a vaginal delivery. You may stay in the hospital or birth center from 1 to 4 days.In some cases, you may be able to go home the same day.  Right after the delivery  Your temperature and blood pressure will be taken until they are  stable. A nurse or other healthcare provider will observe you as you rest. You may have afterbirth pains. These are cramps caused by the uterus shrinking. Sanitary pads are used to soak up the discharge of the uterine lining. To make sure that you aren't bleeding too much, the pad will be checked. And the firmness of your uterus will be checked. To do this, a nurse will gently push down on your stomach. If you had anesthesia, you'll be watched closely until you can feel and move your toes. If you have perineal pain (pain between the vagina and anus), an ice pack can help.  Newborn care  While still in the hospital or birth center, you'll learn how to hold and feed your baby. You willalso be given instructions on how to care for your baby. This includes bathing and feeding.  Preparing to go home  You may be anxious to go home as soon as possible. Before you and your baby go home, a healthcare provider will check to be sure you are healthy enough to take care of your baby and yourself. You're ready to go home when:   You can walk to the bathroom and use the bathroom without help.   You can eat solid food and swallow pills (if needed).   You have no sign of infection or other health problems, including fever.   You have adequate pain control.   Your bleeding isn't excessive.   You are able to care for your newborn and are emotionally stable.  Before leaving the hospital or birth center, you'll be given written instructions for home self-care after vaginal delivery. Be sure to follow these instructions carefully. If you have questions or concerns, talk about them now.  If you have stitches  You may have received stitches in the skin near your vagina. The stitches might have closed an episiotomy (an incision that enlarges the opening of the vagina). Or you may have needed stitches to repair torn skin. Either way, your stitches should dissolve within weeks. Until then, you can help reduce discomfort, aid healing,  and reduce your risk of infection by keeping the stitches clean. These tips can help:   Gently wipe from front to back after you urinate or have a bowel movement.   After wiping, spray warm water on the area. Or you can have a sitz bath. This means sitting in a tub with a few inches of water in it. Then pat the area dry or use a hairdryer on a cool setting.   Do not use soap or any solution except water on the area.   You can take a shower unless told not to.   Change sanitary pads at least every 2 to 4 hours.   Place cold or heat packs  on the area as directed by your healthcare providers or nurses. Keep a thin towel between the pack and your skin.   Sit on firm seats so the stitches pull less.  Postnatal follow-up  Schedule a postnatal follow-up exam with your healthcare provider for about 6 weeks after delivery. During this exam, your uterus and vaginal area will be checked. Contact your healthcare provider if you think you or your baby are having any problems.  When to call your healthcare provider  Call your healthcare provider right away if you have:   A fever of 100.14F (38.0C) or higher   Bleeding that needs a new sanitary pad after an hour, or large blood clots   Pain in your vagina that gets worse and isn't relieved with medicine   Swelling, discharge, or increased pain from vaginal tear or episiotomy   Burning, pain, red streaks, or lumpy areas in your breasts that may be accompanied by flu-like symptoms   Cracks, blisters, or blood on your nipples   Burning or pain when you urinate   Nausea or vomiting   Dizziness or fainting   Feelings of extreme sadness or anxiety, or a feeling that you don't want to be with your baby   Belly pain that isn't relieved with medicine   Vaginal discharge that has a bad odor   No bowel movement for 5 days   Painful urination, or inability to control urination   Redness, warmth, or pain in the lower leg   Chest pain  StayWell last reviewed this  educational content on 04/29/2016   2000-2020 [employer] [place]. [address], [place], [place] [address]. All rights reserved. This information is not intended as a substitute for professional medical care. Always follow your healthcare professional's instructions.  Patient Education   Patient Education     For New Mothers: Staying Fit After Delivery    After you deliver your baby, you can start to exercise when you feel ready. Let your body be your guide. Most women are ready to exercise after 6 weeks,where some women will be ready a few days after giving birth. If you've had a cesarean section, youwillneed more time.Ask your healthcare provider when it is safe tostart exercising again.  Exercise tips for new mothers  You can start doing Kegel exercises as soon as you deliver your baby. Do them at least 10 times a day to help avoid bladder problems later on. Kegel exercises help strengthen your pelvic muscles. To do them, squeeze the muscles that you use to stop passing urine (do not do this while urinating). Hold that squeeze for a count of 10, then release.  You will want to resume other exercise gradually and talk to your healthcare provider before starting. Always exercise with care. When you first start exercising after giving birth, try simple exercises that help strengthen major muscle groups, including abdominal and back muscles. Slowly add moderate-intensity exercise.Try to work up to at least 150 minutes of moderate-intensity aerobic activity every week.Moderate intensity means you are moving enough to raise your heart rate and start sweating. You can still talk normally. But you cannot sing.Muscle-strengthening exercises should be done along with your aerobic activity on at least 2 days a week. Look for ways to combine exercising with being with your new baby. Try putting your baby into a front pack or in the stroller and take a walk.  Strengthening stomach muscles  Many new mothers  want to strengthen their stomach muscles after  giving birth. Try this exercise when you're ready to resume your program. It will strengthen the front and side muscles ofyour stomach:   Lie on your back with your knees bent and feet flat on the floor. Cross your arms over your stomach. Use your fingers to gently pull the sides of the stomach toward the middle of your body.   Exhale and try to pull the stomach muscles toward your spine. Gently raise your shoulders off the floor, no more than 6 to 8 inches. Hold for 5 seconds. Repeat 5 times.  StayWell last reviewed this educational content on 06/30/2016   2000-2020 [employer] [place]. [address], [place], [place] [id number]. All rights reserved. This information is not intended as a substitute for professional medical care. Always follow your healthcare professional's instructions.           After Giving Birth: Changing Expectations for Parents     Outings with your baby will help form new family bonds.     Congratulations on your new baby! Diapers won't be the only thing you'll change in the months ahead. Your sense of yourself and how you relate to your partner will also be different. If you have other children, expect some emotional swings, as you and your family try out your new roles.  Not always rosy  Most new mothers experience some form of the baby blues. These mood swings are caused by hormonal shifts in your body. Stress due to the recent changes in your life and lack of sleep also have an effect. The baby blues may last a few days or up to 2 weeks. You may feel a sense of loss, frustration, or anger. Or you may be sad that having a baby isn't what you'd imagined. Sometimes a birth triggers childhood memories or reminds you about the death of a loved one.  Balancing the blues  Recognize your need to talk, to feel protected, to have private time. Allow yourself to cry, to sit, to think. Ask for help when you need it, and accept help when it's  offered. Knowing your needs is not a weakness. Share your thoughts with your partner. Or pick up the phone and call a friend, your mother, a sister, or an aunt. Rest, eat right, and get some light exercise. The mind feels best when the body feels good.  Shaping your family  Over the next year, your household will go through many changes. If this is your first child, you and your partner will have to adjust to the idea of being a family. If you have older children, help them adjust to the new baby. Sharing chores, time, and attention is something you'll all need to work on. If you're a single mother, you may find that your baby has a "family" of friends as well as relatives.  Share activities  As a newborn, your baby has many needs. These must be blended into the family's style. Take the baby on outings, so he or she is part of the family from the beginning. Involve everyone in activities you all can enjoy doing together.  As parents and lovers  The demands on your relationship have just increased. So do your best to strengthen your partnership ties. Set aside time to talk every day. Put away the dinner dishes together or take a break before bedtime. Also, try to spend time alone. It will help you remember why you're together. Return to sex when it feels right and it's OK  with your healthcare provider. But don't think of breastfeeding as a form of birth control. Instead, talk with your healthcare provider about birth control methods that might be right for this time in your life.  When to call your healthcare provider  Call your healthcare provider if you have any of the following concerns:   You don't want to be with the baby.   You have no interest in eating or are not able to sleep.   Your symptoms are not getting better, and you're getting more upset.   You think you may harm yourself or the baby.  The Depression After Delivery hotline 236-660-5989) may also be helpful.  StayWell last reviewed this  educational content on 06/30/2016   2000-2020 [employer], [place]. [address] [place], [place] [address]. All rights reserved. This information is not intended as a substitute for professional medical care. Always follow your healthcare professional's instructions.  Patient Education     Breast Care After Birth  A few days after your baby's birth, your breasts will swell with milk. They are likely to feel tender and heavy. This is normal. To help prevent breast soreness and control irritation, follow these tips:     Moist heat, like a shower, helps to promote the release of breastmilk.   Coping with swelling  Here are tips to cope with swelling:   Use cold compresses or an ice pack to help reduce the ache or pain.   Breastfeed often to keep milk from clogging your breast ducts.   If your nipples are flat from breast swelling, hand express some milk. Squeeze out a few drops of milk by massaging and compressing your breasts.   If you have swelling with pain or fever, call your healthcare provider.  Preventing sore nipples  Here are tips to prevent sore nipples:   Make sure baby latches on to your breast correctly. The baby's mouth should be opened very wideand your entire areola should be in the baby's mouth.   You can let milk dry on your nipples. This dried milk can protect the skin on your nipple.   Do not use alcohol, soap, or scented cleansers on your breasts. These can cause the nipples to dry and crack.   Do not wear nursing pads that are lined with plastic. They hold in moisture and can cause chapping.   If you have cracked or bleeding nipples, consult your healthcare provider or a Advertising copywriter. He or she will make sure that your baby's latch is correct and may suggest topical treatment, like pure lanolin.  Choosing a good bra  Wearing the right-sized bra is especially important now. If a bra is too tight, it may cause a duct in your breast to clog and become irritated. If possible,  have a salesperson help fit you for a new bra. Look for one that's 100% cotton and comfortable. Also, choose a bra with wide straps that won't dig into your back and shoulders. If you're breastfeeding, find a nursing bra that allows you to uncoverone breast at a time.  If you are not breastfeeding  Here are tips to avoid discomfort:   Avoid stimulation of nipples   Wear a tight-fitting bra   Apply cold compresses or ice packs for discomfort    Call your healthcare provider  Call your healthcare provider right away if you have any of the following:   A fever or chills   Extreme tiredness and body aches, as if you  have the flu   Burning or pain inone or both breasts   Red streaks on a breast   Hard or lumpy spots in one or both breasts   A feeling of warmth or heat in one or both breasts   Breasts so swollen your baby cannot latch on to the nipples   Nipples that are cracked or bleeding   Low milk supply or your milk does not flow freely   StayWell last reviewed this educational content on 05/30/2016   2000-2020 [employer]. [address], [place] [address]. All rights reserved. This information is not intended as a substitute for professional medical care. Always follow your healthcare professional's instructions.

## 2019-01-11 NOTE — Anesthesia Postprocedure Evaluation (Signed)
Anesthesia Post Op Evaluation    Patient: Erika Greene  ANES - LABOR ANALGESIA    Last Vitals:Temperature: 37.2 C (99 F) (01/11/19 0418)  Heart Rate: 91 (01/10/19 1807)  BP (Non-Invasive): (!) 145/73 (01/11/19 0418)  Respiratory Rate: 18 (01/11/19 0418)  SpO2: 98 % (01/11/19 0418)    Patient is sufficiently recovered from the effects of anesthesia to participate in the evaluation and has returned to their pre-procedure level.  Patient location during evaluation: bedside       Patient participation: complete - patient participated  Level of consciousness: awake  Multimodal Pain Management: Multimodal analgesia used between 6 hours prior to anesthesia start to PACU discharge  Pain score: 0  Pain management: adequate  Airway patency: patent    Anesthetic complications: no  Cardiovascular status: acceptable  Respiratory status: acceptable  Hydration status: acceptable  Patient post-procedure temperature: Pt Normothermic   PONV Status: Absent  Comments: Patient denies headache, lower extremity paresthesia or weakness, urinary retention, and significant back pain.    Philmore Pali, M.D.  CA-1/PGY-2 Anesthesiology  [phone]        I reviewed the resident's note.  I agree with the findings and plan of care as documented in the resident's note.  Any exceptions/additions are edited/noted.    Tollie Pizza, MD

## 2019-01-11 NOTE — Lactation Note (Signed)
This note was copied from a baby's chart.     01/11/19 1430   Infant   Breastfeeding Not Observed   Maternal   Maternal Consult   (mother asleep/ spoke to RN via phone)   Lactation Plan   Lactation Consultant at Bedside 1-15 minutes     Arrived for lactation consult.  Mother asleep upon entry and infant asleep in his crib.  I notified pt bedside [place] who states mom reported that infant would not latch this am.  The Rn offered assistance , (pt to call when needed) but mom did not out.  She did decide she wants to pump per RN.  Mom also has bottle fed.  Lactation will need to meet with mother at a later time.  Bedside RN asked to page when mom is awake.

## 2019-01-11 NOTE — Care Plan (Signed)
Pt s/p SVD. Pt likely to d/c 24 hours after delivery if there are no complications.      The patient will continue to be evaluated for developing discharge needs.

## 2019-01-11 NOTE — Nurses Notes (Signed)
Phone call to Dr. Vella Redhead office to update them about delivery and maternal/newborn status.  Spoke with Erasmo Downer. They will be happy to see [facility] for her postpartum visit.

## 2019-01-11 NOTE — Nurses Notes (Signed)
Patient is rooming is with infant. Mom's discharge is completed.  Mom's postpartum education was completed.  Questions answered.  Mom has no more questions.  Mom remains at baby's bedside.

## 2019-01-15 ENCOUNTER — Encounter (HOSPITAL_COMMUNITY): Payer: Self-pay | Admitting: OBSTETRICS/GYNECOLOGY

## 2020-06-06 ENCOUNTER — Emergency Department: Payer: Medicaid Other

## 2020-06-06 ENCOUNTER — Encounter: Payer: Self-pay | Admitting: Emergency Medicine

## 2020-06-06 ENCOUNTER — Other Ambulatory Visit: Payer: Self-pay

## 2020-06-06 ENCOUNTER — Emergency Department
Admission: EM | Admit: 2020-06-06 | Discharge: 2020-06-06 | Disposition: A | Discharge status: Home / Self Care | Payer: Medicaid Other | Attending: Emergency Medicine | Admitting: Emergency Medicine

## 2020-06-06 DIAGNOSIS — K8050 Calculus of bile duct without cholangitis or cholecystitis without obstruction: Secondary | ICD-10-CM

## 2020-06-06 DIAGNOSIS — K8020 Calculus of gallbladder without cholecystitis without obstruction: Secondary | ICD-10-CM

## 2020-06-06 DIAGNOSIS — R1010 Upper abdominal pain, unspecified: Secondary | ICD-10-CM

## 2020-06-06 DIAGNOSIS — R1013 Epigastric pain: Secondary | ICD-10-CM | POA: Diagnosis present

## 2020-06-06 HISTORY — DX: Calculus of gallbladder without cholecystitis without obstruction: K80.20

## 2020-06-06 LAB — URINALYSIS, COMPLETE (UACMP) WITH MICROSCOPIC
Bilirubin Urine: NEGATIVE
Glucose, UA: NEGATIVE mg/dL
Hgb urine dipstick: NEGATIVE
Ketones, ur: NEGATIVE mg/dL
Nitrite: NEGATIVE
Protein, ur: NEGATIVE mg/dL
Specific Gravity, Urine: 1.021 (ref 1.005–1.030)
pH: 6 (ref 5.0–8.0)

## 2020-06-06 LAB — CBC
HCT: 41.6 % (ref 36.0–46.0)
Hemoglobin: 13.2 g/dL (ref 12.0–15.0)
MCH: 29.7 pg (ref 26.0–34.0)
MCHC: 31.7 g/dL (ref 30.0–36.0)
MCV: 93.7 fL (ref 80.0–100.0)
Platelets: 236 10*3/uL (ref 150–400)
RBC: 4.44 MIL/uL (ref 3.87–5.11)
RDW: 13.5 % (ref 11.5–15.5)
WBC: 10.7 10*3/uL — ABNORMAL HIGH (ref 4.0–10.5)
nRBC: 0 % (ref 0.0–0.2)

## 2020-06-06 LAB — COMPREHENSIVE METABOLIC PANEL
ALT: 15 U/L (ref 0–44)
AST: 18 U/L (ref 15–41)
Albumin: 4 g/dL (ref 3.5–5.0)
Alkaline Phosphatase: 86 U/L (ref 38–126)
Anion gap: 10 (ref 5–15)
BUN: 8 mg/dL (ref 6–20)
CO2: 26 mmol/L (ref 22–32)
Calcium: 8.9 mg/dL (ref 8.9–10.3)
Chloride: 104 mmol/L (ref 98–111)
Creatinine, Ser: 0.63 mg/dL (ref 0.44–1.00)
GFR, Estimated: >60 mL/min (ref >60)
Glucose, Bld: 121 mg/dL — ABNORMAL HIGH (ref 70–99)
Potassium: 4.1 mmol/L (ref 3.5–5.1)
Sodium: 140 mmol/L (ref 135–145)
Total Bilirubin: 0.5 mg/dL (ref 0.3–1.2)
Total Protein: 7.1 g/dL (ref 6.5–8.1)

## 2020-06-06 LAB — LIPASE, BLOOD: Lipase: 38 U/L (ref 11–51)

## 2020-06-06 LAB — POC URINE PREG, ED: Preg Test, Ur: NEGATIVE

## 2020-06-06 MED ORDER — KETOROLAC TROMETHAMINE 30 MG/ML IJ SOLN
30.0000 mg | Freq: Once | INTRAMUSCULAR | Status: AC
  Administered 2020-06-06: 30 mg via INTRAMUSCULAR
  Filled 2020-06-06: qty 1

## 2020-06-06 NOTE — ED Notes (Signed)
Patient states she vomited in blue bag and placed in trash. Patient says she vomited her dinner from last night. Patient stated it was in chunks, RN did not witness

## 2020-06-06 NOTE — ED Triage Notes (Signed)
Pt reports pain to her upper abd that started about 3 hours ago. Pt reports pain is mid epigastric area and causes some nausea as well.

## 2020-06-06 NOTE — ED Provider Notes (Signed)
[facility] Emergency Department Provider Note   ____________________________________________    I have reviewed the triage vital signs and the nursing notes.   HISTORY  Chief Complaint Abdominal Pain and Nausea     HPI Kelly Zhang is a 20 y.o. female who presents with complaints of epigastric abdominal pain that started this morning. She reports the pain is moderate to severe without radiation. She reports she has had this once before on Christmas day and it seemed to improve after she slept most of the day. Has not take anything for this. No history of abdominal surgery. No fevers chills. No vomiting. No diarrhea.  History reviewed. No pertinent past medical history.  There are no problems to display for this patient.   History reviewed. No pertinent surgical history.  Prior to Admission medications   Not on File     Allergies Patient has no allergy information on record.  No family history on file.  Social History    Review of Systems  Constitutional: No fever/chills Eyes: No visual changes.  ENT: No sore throat. Cardiovascular: Denies chest pain. Respiratory: Denies shortness of breath. Gastrointestinal: As above Genitourinary: Negative for dysuria. Musculoskeletal: Negative for back pain. Skin: Negative for rash. Neurological: Negative for headaches    ____________________________________________   PHYSICAL EXAM:  VITAL SIGNS: ED Triage Vitals  Enc Vitals Group     BP 06/06/20 0727 (!) 142/78     Pulse Rate 06/06/20 0727 74     Resp 06/06/20 0727 20     Temp 06/06/20 0727 97.7 F (36.5 C)     Temp Source 06/06/20 0727 Oral     SpO2 06/06/20 0727 100 %     Weight 06/06/20 0727 81.6 kg (180 lb)     Height 06/06/20 0727 1.6 m (5\' 3" )     Head Circumference --      Peak Flow --      Pain Score 06/06/20 0730 10     Pain Loc --      Pain Edu? --      Excl. in GC? --     Constitutional: Alert and oriented.    Nose: No congestion/rhinnorhea. Mouth/Throat: Mucous membranes are moist.    Cardiovascular: Normal rate, regular rhythm.   Good peripheral circulation. Respiratory: Normal respiratory effort.  No retractions. Lungs CTAB. Gastrointestinal: Tenderness in epigastrium and right upper quadrant, abdomen soft,. No distention.  No CVA tenderness.  Musculoskeletal: No lower extremity tenderness nor edema.  Warm and well perfused Neurologic:  Normal speech and language. No gross focal neurologic deficits are appreciated.  Skin:  Skin is warm, dry and intact. No rash noted. Psychiatric: Mood and affect are normal. Speech and behavior are normal.  ____________________________________________   LABS (all labs ordered are listed, but only abnormal results are displayed)  Labs Reviewed  COMPREHENSIVE METABOLIC PANEL - Abnormal; Notable for the following components:      Result Value   Glucose, Bld 121 (*)    All other components within normal limits  CBC - Abnormal; Notable for the following components:   WBC 10.7 (*)    All other components within normal limits  URINALYSIS, COMPLETE (UACMP) WITH MICROSCOPIC - Abnormal; Notable for the following components:   Color, Urine YELLOW (*)    APPearance HAZY (*)    Leukocytes,Ua SMALL (*)    Bacteria, UA RARE (*)    All other components within normal limits  LIPASE, BLOOD  POC URINE PREG, ED   ____________________________________________  EKG  None ____________________________________________  RADIOLOGY  Ultrasound reviewed by me, multiple gallstones noted, no pericholecystic fluid ____________________________________________   PROCEDURES  Procedure(s) performed: No  Procedures   Critical Care performed: No ____________________________________________   INITIAL IMPRESSION / ASSESSMENT AND PLAN / ED COURSE  Pertinent labs & imaging results that were available during my care of the patient were reviewed by me and considered  in my medical decision making (see chart for details).  Patient presents with epigastric pain as detailed above. Mild tenderness over the stomach but increased tenderness over the right upper quadrant, suspicious for biliary colic versus cholecystitis. Gastritis/PUD is also on the differential. No risk factors for pancreatitis  Lipase is normal, lab work is overall reassuring, LFTs are normal. Mild elevation of white blood cell count is nonspecific,  Treated with IM Toradol, obtain ultrasound of the right upper quadrant   Ultrasound reviewed by me consistent with cholelithiasis, no evidence of cholecystitis.  Patient improved after IM Toradol will refer to general surgery      ____________________________________________   FINAL CLINICAL IMPRESSION(S) / ED DIAGNOSES  Final diagnoses:  Upper abdominal pain  Biliary colic        Note:  This document was prepared using Dragon voice recognition software and may include unintentional dictation errors.   Jene Every, MD 06/06/20 1032

## 2020-06-06 NOTE — ED Notes (Signed)
Patient discharged home, patient received discharge papers. Patient appropriate and cooperative. Vital signs taken. NAD noted. 

## 2020-06-06 NOTE — ED Notes (Signed)
Patient is NPO

## 2020-06-14 ENCOUNTER — Other Ambulatory Visit: Payer: Self-pay

## 2020-06-14 ENCOUNTER — Encounter: Payer: Self-pay | Admitting: Emergency Medicine

## 2020-06-14 ENCOUNTER — Emergency Department
Admission: EM | Admit: 2020-06-14 | Discharge: 2020-06-14 | Disposition: A | Discharge status: Home / Self Care | Payer: Medicaid Other | Attending: Emergency Medicine | Admitting: Emergency Medicine

## 2020-06-14 DIAGNOSIS — K029 Dental caries, unspecified: Secondary | ICD-10-CM | POA: Diagnosis not present

## 2020-06-14 DIAGNOSIS — K0889 Other specified disorders of teeth and supporting structures: Secondary | ICD-10-CM | POA: Diagnosis present

## 2020-06-14 MED ORDER — AMOXICILLIN 500 MG PO CAPS
500.0000 mg | ORAL_CAPSULE | Freq: Once | ORAL | Status: AC
  Administered 2020-06-14: 500 mg via ORAL
  Filled 2020-06-14: qty 1

## 2020-06-14 MED ORDER — AMOXICILLIN 500 MG PO CAPS: 500.0000 mg | ORAL_CAPSULE | Freq: Three times a day (TID) | ORAL | 0 refills | Status: AC

## 2020-06-14 MED ORDER — LIDOCAINE VISCOUS HCL 2 % MT SOLN
15.0000 mL | Freq: Once | OROMUCOSAL | Status: AC
  Administered 2020-06-14: 15 mL via OROMUCOSAL
  Filled 2020-06-14: qty 15

## 2020-06-14 MED ORDER — LIDOCAINE VISCOUS HCL 2 % MT SOLN: 10.0000 mL | OROMUCOSAL | 0 refills | Status: AC | PRN

## 2020-06-14 MED ORDER — KETOROLAC TROMETHAMINE 30 MG/ML IJ SOLN
30.0000 mg | Freq: Once | INTRAMUSCULAR | Status: AC
  Administered 2020-06-14: 30 mg via INTRAMUSCULAR
  Filled 2020-06-14: qty 1

## 2020-06-14 MED ORDER — KETOROLAC TROMETHAMINE 10 MG PO TABS: 10.0000 mg | ORAL_TABLET | Freq: Four times a day (QID) | ORAL | 0 refills | Status: AC | PRN

## 2020-06-14 NOTE — ED Triage Notes (Signed)
Pt reports toothache on the right side of several teeth for awhile now

## 2020-06-14 NOTE — Discharge Instructions (Signed)
OPTIONS FOR DENTAL FOLLOW UP CARE ° °[place] Department of Health and Human Services - Local Safety Net Dental Clinics °http://www.ncdhhs.gov/dph/oralhealth/services/safetynetclinics.htm °  °[facility] (336-562-3123) ° °[facility] [place] ([phone]) ° °[facility] ([phone] ext [phone]) ° °[facility] ([phone]) ° °[facility] ([phone]) °This clinic caters to the indigent population and is on a lottery system. °Location: °[facility], [address], [place] °Clinic Hours: °Wednesdays from 6pm - 9pm, patients seen by a lottery system. °For dates, call or go to [email or web address].[facility].edu/[facility]/patients/Dental-[facility] °Services: °Cleanings, fillings and simple extractions. °Payment Options: °DENTAL WORK IS FREE OF CHARGE. Bring proof of income or support. °Best way to get seen: °Arrive at 5:15 pm - this is a lottery, NOT first come/first serve, so arriving earlier will not increase your chances of being seen. °  °  °[facility] °[phone] °Select option 1 for emergencies °  °Location: °[facility], [address], [place] °Clinic Hours: °No walk-ins accepted - call the day before to schedule an appointment. °Check in times are 9:30 am and 1:30 pm. °Services: °Simple extractions, temporary fillings, pulpectomy/pulp debridement, uncomplicated abscess drainage. °Payment Options: °PAYMENT IS DUE AT THE TIME OF SERVICE.  Fee is usually $100-200, additional surgical procedures (e.g. abscess drainage) may be extra. °Cash, checks, Visa/MasterCard accepted.  Can file Medicaid if patient is covered for dental - patient should call case worker to check. °No discount for [facility] patients. °Best way to get seen: °MUST call the day before and get onto the schedule. Can usually be seen the next 1-2 days. No walk-ins accepted. °  °  °[facility] °[phone] °   °Location: °[facility], [address], [place] °Clinic Hours: °M, W, Th, F 8am or 1:30pm, Tues 9a or 1:30 - first come/first served. °Services: °Simple extractions, temporary fillings, uncomplicated abscess drainage.  You do not need to be an [place] resident. °Payment Options: °PAYMENT IS DUE AT THE TIME OF SERVICE. °Dental insurance, otherwise sliding scale - bring proof of income or support. °Depending on income and treatment needed, cost is usually $50-200. °Best way to get seen: °Arrive early as it is first come/first served. °  °  °[facility] °[phone] °  °Location: °[address] °Clinic Hours: °Mon-Thu 8a-5p °Services: °Most basic dental services including extractions and fillings. °Payment Options: °PAYMENT IS DUE AT THE TIME OF SERVICE. °Sliding scale, up to 50% off - bring proof if income or support. °Medicaid with dental option accepted. °Best way to get seen: °Call to schedule an appointment, can usually be seen within 2 weeks OR they will try to see walk-ins - show up at 8a or 2p (you may have to wait). °  °  °[facility] °[phone] °[place] RESIDENTS ONLY °  °Location: °[facility], [address], [place], [place] [address] °Clinic Hours: By appointment only. °Monday - Thursday 8am-5pm, Friday 8am-12pm °Services: Cleanings, fillings, extractions. °Payment Options: °PAYMENT IS DUE AT THE TIME OF SERVICE. °Cash, Visa or MasterCard. Sliding scale - $30 minimum per service. °Best way to get seen: °Come in to office, complete packet and make an appointment - need proof of income °or support monies for each household member and proof of [place] residence. °Usually takes about a month to get in. °  °  °[facility] °[phone] °  °Location: °[address]   [place] °Clinic Hours: Walk-in Urgent Care Dental Services are offered Monday-Friday  mornings only. °The numbers of emergencies accepted daily is limited to the number of °providers available. °Maximum 15 - Mondays, Wednesdays & Thursdays °Maximum 10 - Tuesdays & Fridays °Services: °You do not need to be a [place] resident to be seen for a dental emergency. °Emergencies are defined as pain, swelling, abnormal bleeding, or dental trauma. Walkins will receive x-rays if needed. °NOTE: Dental cleaning is not an emergency. °Payment Options: °PAYMENT IS DUE AT THE TIME OF SERVICE. °Minimum co-pay is $40.00 for uninsured patients. °Minimum co-pay is $3.00 for Medicaid with dental coverage. °Dental Insurance is accepted and must be presented at time of visit. °Medicare does not cover dental. °Forms of payment: Cash, credit card, checks. °Best way to get seen: °If not previously registered with the clinic, walk-in dental registration begins at 7:15 am and is on a first come/first serve basis. °If previously registered with the clinic, call to make an appointment. °  °  °[facility] °[phone] °[place] RESIDENTS ONLY °  °Location: °[address], [place], [place] °Clinic Hours: °Mon-Thu 10a-2p °Services: Extractions only! °Payment Options: °FREE (donations accepted) - bring proof of income or support °Best way to get seen: °Call and schedule an appointment OR come at 8am on the 1st Monday of every month (except for holidays) when it is first come/first served. °  °  °[facility] °[phone] °  °Location: °[address], [place] °Clinic Hours: °Friday mornings °Services, Payment Options, Best way to get seen: °Call for info °

## 2020-06-14 NOTE — ED Provider Notes (Signed)
[facility] Emergency Department Provider Note  ____________________________________________  Time seen: Approximately 1:56 PM  I have reviewed the triage vital signs and the nursing notes.   HISTORY  Chief Complaint Dental Pain    HPI Kelly Zhang is a 20 y.o. female that presents to the emergency department for evaluation of right upper dental pain for several weeks.  Patient states that she just got insurance that she has not yet been able to see a dentist.  She has had intermittent dental pain to her upper and lower right sided teeth for many months.  Her bottom teeth pain has resolved.  The pain was worse today to her right upper molar that has a silver And canine that has a large cavity so she came to the emergency department for pain control.  She hopes to follow-up with a dentist soon now that she has insurance.  No fever, swelling.   History reviewed. No pertinent past medical history.  There are no problems to display for this patient.   History reviewed. No pertinent surgical history.  Prior to Admission medications   Medication Sig Start Date End Date Taking? Authorizing Provider  amoxicillin (AMOXIL) 500 MG capsule Take 1 capsule (500 mg total) by mouth 3 (three) times daily. 06/14/20  Yes Enid Derry, PA-C  ketorolac (TORADOL) 10 MG tablet Take 1 tablet (10 mg total) by mouth every 6 (six) hours as needed. 06/14/20  Yes Enid Derry, PA-C  lidocaine (XYLOCAINE) 2 % solution Use as directed 10 mLs in the mouth or throat as needed. 06/14/20  Yes Enid Derry, PA-C    Allergies Patient has no allergy information on record.  No family history on file.  Social History     Review of Systems  Constitutional: No fever/chills Cardiovascular: No chest pain. Respiratory: No SOB. Gastrointestinal: No abdominal pain.  No nausea, no vomiting.  Musculoskeletal: Negative for musculoskeletal pain. Skin: Negative for rash, abrasions,  lacerations, ecchymosis. Neurological: Negative for headaches   ____________________________________________   PHYSICAL EXAM:  VITAL SIGNS: ED Triage Vitals  Enc Vitals Group     BP 06/14/20 1229 123/78     Pulse Rate 06/14/20 1229 63     Resp 06/14/20 1229 18     Temp 06/14/20 1229 98.7 F (37.1 C)     Temp src --      SpO2 06/14/20 1229 100 %     Weight 06/14/20 1219 180 lb (81.6 kg)     Height 06/14/20 1219 5\' 3"  (1.6 m)     Head Circumference --      Peak Flow --      Pain Score 06/14/20 1219 0     Pain Loc --      Pain Edu? --      Excl. in GC? --      Constitutional: Alert and oriented. Well appearing and in no acute distress. Eyes: Conjunctivae are normal. PERRL. EOMI. Head: Atraumatic. ENT:      Ears:      Nose: No congestion/rhinnorhea.      Mouth/Throat: Mucous membranes are moist.  Silver To right upper molar.  Cavity to right upper canine.  No swelling.  Full range of motion of jaw. Neck: No stridor. Cardiovascular: Normal rate, regular rhythm.  Good peripheral circulation. Respiratory: Normal respiratory effort without tachypnea or retractions. Lungs CTAB. Good air entry to the bases with no decreased or absent breath sounds. Gastrointestinal: Bowel sounds 4 quadrants. Soft and nontender to palpation. No guarding or rigidity.  No palpable masses. No distention.  Musculoskeletal: Full range of motion to all extremities. No gross deformities appreciated. Neurologic:  Normal speech and language. No gross focal neurologic deficits are appreciated.  Skin:  Skin is warm, dry and intact. No rash noted. Psychiatric: Mood and affect are normal. Speech and behavior are normal. Patient exhibits appropriate insight and judgement.   ____________________________________________   LABS (all labs ordered are listed, but only abnormal results are displayed)  Labs Reviewed - No data to  display ____________________________________________  EKG   ____________________________________________  RADIOLOGY   No results found.  ____________________________________________    PROCEDURES  Procedure(s) performed:    Procedures    Medications  ketorolac (TORADOL) 30 MG/ML injection 30 mg (30 mg Intramuscular Given 06/14/20 1410)  amoxicillin (AMOXIL) capsule 500 mg (500 mg Oral Given 06/14/20 1410)  lidocaine (XYLOCAINE) 2 % viscous mouth solution 15 mL (15 mLs Mouth/Throat Given 06/14/20 1410)     ____________________________________________   INITIAL IMPRESSION / ASSESSMENT AND PLAN / ED COURSE  Pertinent labs & imaging results that were available during my care of the patient were reviewed by me and considered in my medical decision making (see chart for details).  Review of the [place] CSRS was performed in accordance of the [facility] prior to dispensing any controlled drugs.   Patient presents emergency department for evaluation of acute on chronic dental pain.  Vital signs and exam are reassuring.  Patient will be covered for infection with amoxicillin.  Patient will be discharged home with prescriptions for amoxicillin, viscous lidocaine, Toradol. Patient is to follow up with primary care and dentist as directed.  Dental resources were given.  Patient is given ED precautions to return to the ED for any worsening or new symptoms.   Kelly Zhang was evaluated in Emergency Department on 06/14/2020 for the symptoms described in the history of present illness. She was evaluated in the context of the global COVID-19 pandemic, which necessitated consideration that the patient might be at risk for infection with the SARS-CoV-2 virus that causes COVID-19. Institutional protocols and algorithms that pertain to the evaluation of patients at risk for COVID-19 are in a state of rapid change based on information released by regulatory bodies including the CDC and federal and state  organizations. These policies and algorithms were followed during the patient's care in the ED.  ____________________________________________  FINAL CLINICAL IMPRESSION(S) / ED DIAGNOSES  Final diagnoses:  Pain due to dental caries      NEW MEDICATIONS STARTED DURING THIS VISIT:  ED Discharge Orders         Ordered    amoxicillin (AMOXIL) 500 MG capsule  3 times daily        06/14/20 1420    lidocaine (XYLOCAINE) 2 % solution  As needed        06/14/20 1420    ketorolac (TORADOL) 10 MG tablet  Every 6 hours PRN        06/14/20 1420              This chart was dictated using voice recognition software/Dragon. Despite best efforts to proofread, errors can occur which can change the meaning. Any change was purely unintentional.    Enid Derry, PA-C 06/14/20 1451    Chesley Noon, MD 06/14/20 [phone]

## 2020-06-29 ENCOUNTER — Ambulatory Visit
Admission: EM | Admit: 2020-06-29 | Discharge: 2020-06-29 | Disposition: A | Discharge status: Home / Self Care | Payer: Medicaid Other

## 2020-07-02 ENCOUNTER — Emergency Department
Admission: EM | Admit: 2020-07-02 | Discharge: 2020-07-03 | Disposition: A | Discharge status: Home / Self Care | Payer: Medicaid Other | Attending: Emergency Medicine | Admitting: Emergency Medicine

## 2020-07-02 ENCOUNTER — Emergency Department: Payer: Medicaid Other

## 2020-07-02 ENCOUNTER — Other Ambulatory Visit: Payer: Self-pay

## 2020-07-02 DIAGNOSIS — R1010 Upper abdominal pain, unspecified: Secondary | ICD-10-CM | POA: Diagnosis present

## 2020-07-02 DIAGNOSIS — K8020 Calculus of gallbladder without cholecystitis without obstruction: Secondary | ICD-10-CM | POA: Diagnosis not present

## 2020-07-02 DIAGNOSIS — K8050 Calculus of bile duct without cholangitis or cholecystitis without obstruction: Secondary | ICD-10-CM

## 2020-07-02 LAB — COMPREHENSIVE METABOLIC PANEL
ALT: 13 U/L (ref 0–44)
AST: 16 U/L (ref 15–41)
Albumin: 4 g/dL (ref 3.5–5.0)
Alkaline Phosphatase: 78 U/L (ref 38–126)
Anion gap: 8 (ref 5–15)
BUN: 13 mg/dL (ref 6–20)
CO2: 23 mmol/L (ref 22–32)
Calcium: 9.2 mg/dL (ref 8.9–10.3)
Chloride: 108 mmol/L (ref 98–111)
Creatinine, Ser: 0.7 mg/dL (ref 0.44–1.00)
GFR, Estimated: >60 mL/min (ref >60)
Glucose, Bld: 108 mg/dL — ABNORMAL HIGH (ref 70–99)
Potassium: 4 mmol/L (ref 3.5–5.1)
Sodium: 139 mmol/L (ref 135–145)
Total Bilirubin: 0.5 mg/dL (ref 0.3–1.2)
Total Protein: 6.9 g/dL (ref 6.5–8.1)

## 2020-07-02 LAB — LIPASE, BLOOD: Lipase: 41 U/L (ref 11–51)

## 2020-07-02 LAB — URINALYSIS, COMPLETE (UACMP) WITH MICROSCOPIC
Bilirubin Urine: NEGATIVE
Glucose, UA: NEGATIVE mg/dL
Hgb urine dipstick: NEGATIVE
Ketones, ur: NEGATIVE mg/dL
Nitrite: NEGATIVE
Protein, ur: NEGATIVE mg/dL
Specific Gravity, Urine: 1.018 (ref 1.005–1.030)
pH: 6 (ref 5.0–8.0)

## 2020-07-02 LAB — CBC
HCT: 37.8 % (ref 36.0–46.0)
Hemoglobin: 12.2 g/dL (ref 12.0–15.0)
MCH: 29.8 pg (ref 26.0–34.0)
MCHC: 32.3 g/dL (ref 30.0–36.0)
MCV: 92.2 fL (ref 80.0–100.0)
Platelets: 208 10*3/uL (ref 150–400)
RBC: 4.1 MIL/uL (ref 3.87–5.11)
RDW: 13.4 % (ref 11.5–15.5)
WBC: 8.9 10*3/uL (ref 4.0–10.5)
nRBC: 0 % (ref 0.0–0.2)

## 2020-07-02 LAB — POC URINE PREG, ED: Preg Test, Ur: NEGATIVE

## 2020-07-02 NOTE — ED Provider Notes (Addendum)
[facility] Emergency Department Provider Note  ____________________________________________   Event Date/Time   First MD Initiated Contact with Patient 07/02/20 2256     (approximate)  I have reviewed the triage vital signs and the nursing notes.   HISTORY  Chief Complaint Abdominal Pain    HPI Kelly Zhang is a 20 y.o. female with no chronic medical issues other than a prior diagnosis about a month ago of cholelithiasis.  She presents tonight for acute onset and moderate to severe upper abdominal pain that radiates through to her back.  She reports that is constant, aching and occasionally sharp, nothing in particular seems to make it better or worse except that it gets worse with anything she eats or drinks.  She has had no nausea nor vomiting.  It is similar to the pain she had about a month ago when she was diagnosed with gallstones.  She said that because of her work schedule she has not been able to schedule an appointment with the surgeon to talk about outpatient surgery.  The pain started about 5:30 in the morning (approximately 16-18 hrs. ago) and has waxed and waned a little bit in severity but has not gone away.  She denies fever/chills, sore throat, chest pain, shortness of breath, nausea, vomiting, and diarrhea.  She has had no dysuria, no increased urinary frequency, and no pelvic or vaginal pain.        Past Medical History:  Diagnosis Date  . Cholelithiasis 06/06/2020    There are no problems to display for this patient.   History reviewed. No pertinent surgical history.  Prior to Admission medications   Medication Sig Start Date End Date Taking? Authorizing Provider  dicyclomine (BENTYL) 10 MG capsule Take 1 capsule (10 mg total) by mouth 3 (three) times daily as needed for up to 14 days for spasms. or abdominal pain 07/03/20 07/17/20 Yes Loleta Rose, MD  docusate sodium (COLACE) 100 MG capsule Take 1 tablet once or twice daily as  needed for constipation while taking narcotic pain medicine 07/03/20  Yes Loleta Rose, MD  HYDROcodone-acetaminophen (NORCO/VICODIN) 5-325 MG tablet Take 2 tablets by mouth every 6 (six) hours as needed for moderate pain or severe pain. 07/03/20  Yes Loleta Rose, MD  ondansetron (ZOFRAN ODT) 4 MG disintegrating tablet Allow 1-2 tablets to dissolve in your mouth every 8 hours as needed for nausea/vomiting 07/03/20  Yes Loleta Rose, MD  amoxicillin (AMOXIL) 500 MG capsule Take 1 capsule (500 mg total) by mouth 3 (three) times daily. 06/14/20   Enid Derry, PA-C  ketorolac (TORADOL) 10 MG tablet Take 1 tablet (10 mg total) by mouth every 6 (six) hours as needed. 06/14/20   Enid Derry, PA-C  lidocaine (XYLOCAINE) 2 % solution Use as directed 10 mLs in the mouth or throat as needed. 06/14/20   Enid Derry, PA-C    Allergies Patient has no allergy information on record.  History reviewed. No pertinent family history.  Social History Social History   Tobacco Use  . Smoking status: Never Smoker  . Smokeless tobacco: Never Used  Vaping Use  . Vaping Use: Some days  Substance Use Topics  . Alcohol use: Yes  . Drug use: Never    Review of Systems Constitutional: No fever/chills Eyes: No visual changes. ENT: No sore throat. Cardiovascular: Denies chest pain. Respiratory: Denies shortness of breath. Gastrointestinal: Upper abdominal pain particularly after eating as described above. Genitourinary: Negative for dysuria. Musculoskeletal: Negative for neck pain.  Negative  for back pain. Integumentary: Negative for rash. Neurological: Negative for headaches, focal weakness or numbness.   ____________________________________________   PHYSICAL EXAM:  VITAL SIGNS: ED Triage Vitals  Enc Vitals Group     BP 07/02/20 2124 119/70     Pulse Rate 07/02/20 2124 75     Resp 07/02/20 2124 16     Temp 07/02/20 2124 98.2 F (36.8 C)     Temp Source 07/02/20 2124 Oral     SpO2 07/02/20  2124 98 %     Weight 07/02/20 2122 81.6 kg (180 lb)     Height 07/02/20 2122 1.6 m (5\' 3" )     Head Circumference --      Peak Flow --      Pain Score 07/02/20 2122 9     Pain Loc --      Pain Edu? --      Excl. in GC? --     Constitutional: Alert and oriented.  Generally well-appearing, texting on her phone without any apparent distress. Eyes: Conjunctivae are normal.  Head: Atraumatic. Nose: No congestion/rhinnorhea. Mouth/Throat: Patient is wearing a mask. Neck: No stridor.  No meningeal signs.   Cardiovascular: Normal rate, regular rhythm. Good peripheral circulation. Respiratory: Normal respiratory effort.  No retractions. Gastrointestinal: Soft and nondistended.  Mild tenderness to palpation of the epigastrium and right upper quadrant with equivocal Murphy sign.  No lower abdominal tenderness, no focal peritonitis, no rebound or guarding. Musculoskeletal: No lower extremity tenderness nor edema. No gross deformities of extremities. Neurologic:  Normal speech and language. No gross focal neurologic deficits are appreciated.  Skin:  Skin is warm, dry and intact. Psychiatric: Mood and affect are normal. Speech and behavior are normal.  ____________________________________________   LABS (all labs ordered are listed, but only abnormal results are displayed)  Labs Reviewed  COMPREHENSIVE METABOLIC PANEL - Abnormal; Notable for the following components:      Result Value   Glucose, Bld 108 (*)    All other components within normal limits  URINALYSIS, COMPLETE (UACMP) WITH MICROSCOPIC - Abnormal; Notable for the following components:   Color, Urine YELLOW (*)    APPearance HAZY (*)    Leukocytes,Ua LARGE (*)    Bacteria, UA FEW (*)    All other components within normal limits  URINE CULTURE  LIPASE, BLOOD  CBC  POC URINE PREG, ED   ____________________________________________  EKG  ED ECG REPORT I, Loleta Rose, the attending physician, personally viewed and  interpreted this ECG.  Date: 07/02/2020 EKG Time: 21:21 Rate: 70 Rhythm: normal sinus rhythm with sinus arrhythmia QRS Axis: normal Intervals: normal ST/T Wave abnormalities: normal Narrative Interpretation: no evidence of acute ischemia  ____________________________________________  RADIOLOGY I, Loleta Rose, personally viewed and evaluated these images (plain radiographs) as part of my medical decision making, as well as reviewing the written report by the radiologist.  ED MD interpretation: Cholelithiasis without evidence of cholecystitis  Official radiology report(s): US ABDOMEN LIMITED RUQ (LIVER/GB)  Result Date: 07/03/2020 CLINICAL DATA:  Upper abdominal pain EXAM: ULTRASOUND ABDOMEN LIMITED RIGHT UPPER QUADRANT COMPARISON:  None. FINDINGS: Gallbladder: Multiple layering calcified gallstones are present the largest measuring 7 mm. No gallbladder wall thickening. No sonographic Murphy sign. Common bile duct: Diameter: 2.6 mm Liver: No focal lesion identified. Within normal limits in parenchymal echogenicity. Portal vein is patent on color Doppler imaging with normal direction of blood flow towards the liver. Other: None. IMPRESSION: Cholelithiasis without evidence of acute cholecystitis. Electronically Signed   By: Kandis Fantasia  Avutu M.D.   On: 07/03/2020 00:13    ____________________________________________   PROCEDURES   Procedure(s) performed (including Critical Care):  Procedures   ____________________________________________   INITIAL IMPRESSION / MDM / ASSESSMENT AND PLAN / ED COURSE  As part of my medical decision making, I reviewed the following data within the electronic MEDICAL RECORD NUMBER Nursing notes reviewed and incorporated, Labs reviewed , Old chart reviewed, Notes from prior ED visits and [place] Controlled Substance Database   Differential diagnosis includes, but is not limited to, cholelithiasis, cholecystitis, GERD, SBO/ileus.  Vital signs are normal.  Physical  exam is reassuring with only mild tenderness to palpation.  I reviewed her medical record and verify the diagnosis of cholelithiasis approximately 1 month ago.  Low suspicion for cholecystitis at this time but I will repeat the ultrasound to make sure there are no changes concerning for early cholecystitis.  Her lab work is within normal limits including no leukocytosis and no elevation of her LFTs nor lipase.  Patient appears comfortable.  She has enough discomfort that is making her hard for work so we will consider prescriptions but I stressed to her that follow-up with surgery as an outpatient is going to be the most important plan and she understands.       Clinical Course as of 07/03/20 0049  Fri Jul 03, 2020  0019 US ABDOMEN LIMITED RUQ (LIVER/GB) Cholelithiasis without evidence of cholecystitis.  Updated patient, recommend close outpatient follow-up as before.  Patient understands and agrees with the plan. [CF]  0029 2 Norco by mouth prior to discharge for her discomfort tonight. [CF]  0048 Patient driving, switched Norco for ibuprofen [CF]    Clinical Course User Index [CF] Loleta Rose, MD     ____________________________________________  FINAL CLINICAL IMPRESSION(S) / ED DIAGNOSES  Final diagnoses:  Biliary colic  Calculus of gallbladder without cholecystitis without obstruction     MEDICATIONS GIVEN DURING THIS VISIT:  Medications  ibuprofen (ADVIL) tablet 600 mg (has no administration in time range)     ED Discharge Orders         Ordered    dicyclomine (BENTYL) 10 MG capsule  3 times daily PRN        07/03/20 0025    HYDROcodone-acetaminophen (NORCO/VICODIN) 5-325 MG tablet  Every 6 hours PRN        07/03/20 0025    docusate sodium (COLACE) 100 MG capsule        07/03/20 0025    ondansetron (ZOFRAN ODT) 4 MG disintegrating tablet        07/03/20 0025          *Please note:  Keamber Macfadden was evaluated in Emergency Department on 07/03/2020 for the symptoms  described in the history of present illness. She was evaluated in the context of the global COVID-19 pandemic, which necessitated consideration that the patient might be at risk for infection with the SARS-CoV-2 virus that causes COVID-19. Institutional protocols and algorithms that pertain to the evaluation of patients at risk for COVID-19 are in a state of rapid change based on information released by regulatory bodies including the CDC and federal and state organizations. These policies and algorithms were followed during the patient's care in the ED.  Some ED evaluations and interventions may be delayed as a result of limited staffing during and after the pandemic.*  Note:  This document was prepared using Dragon voice recognition software and may include unintentional dictation errors.   Loleta Rose, MD 07/03/20 [phone]  Loleta Rose, MD 07/03/20 Jacinta Shoe    Loleta Rose, MD 07/03/20 [phone]    Loleta Rose, MD 07/03/20 [phone]

## 2020-07-02 NOTE — ED Triage Notes (Signed)
Pt arrives w constant pulling/stabbing upper abd pain and states she has been here and told she has gallstones. Denies fever, n/v/d. Statrted 5:30am and took pain med around 3pm with no relief.

## 2020-07-02 NOTE — ED Notes (Signed)
Patient transported to Ultrasound 

## 2020-07-03 MED ORDER — IBUPROFEN 600 MG PO TABS
600.0000 mg | ORAL_TABLET | Freq: Once | ORAL | Status: AC
  Administered 2020-07-03: 600 mg via ORAL
  Filled 2020-07-03: qty 1

## 2020-07-03 MED ORDER — HYDROCODONE-ACETAMINOPHEN 5-325 MG PO TABS: 2.0000 | ORAL_TABLET | Freq: Four times a day (QID) | ORAL | 0 refills | Status: DC | PRN

## 2020-07-03 MED ORDER — DOCUSATE SODIUM 100 MG PO CAPS: ORAL_CAPSULE | ORAL | 0 refills | Status: AC

## 2020-07-03 MED ORDER — HYDROCODONE-ACETAMINOPHEN 5-325 MG PO TABS: 2.0000 | ORAL_TABLET | Freq: Once | ORAL | Status: DC

## 2020-07-03 MED ORDER — ONDANSETRON 4 MG PO TBDP: ORAL_TABLET | ORAL | 0 refills | Status: AC

## 2020-07-03 MED ORDER — DICYCLOMINE HCL 10 MG PO CAPS: 10.0000 mg | ORAL_CAPSULE | Freq: Three times a day (TID) | ORAL | 0 refills | Status: AC | PRN

## 2020-07-03 NOTE — ED Notes (Signed)
Pt calm , collective ,  

## 2020-07-03 NOTE — Discharge Instructions (Addendum)
You have been seen in the Emergency Department (ED) for abdominal pain.  Your evaluation suggests that your pain is caused by gallstones.  Fortunately you do not need immediate surgery at this time, but it is important that you follow up with a surgeon as an outpatient; typically surgical removal of the gallbladder is the only thing that will definitively fix your issue.  Read through the included information about a bland diet, and use any prescribed medications as instructed.  Avoid smoking and alcohol use.  Please follow up as instructed above regarding todays emergent visit and the symptoms that are bothering you.  Try taking the prescribed medication dicyclomine (Bentyl) when you have to work.  This may help with your abdominal pain.  You may also use over-the-counter ibuprofen and/or Tylenol according to the label instructions.  Take Norco as prescribed. Do not drink alcohol, drive or participate in any other potentially dangerous activities while taking this medication as it may make you sleepy. Do not take this medication with any other sedating medications, either prescription or over-the-counter. If you were prescribed Percocet or Vicodin, do not take these with acetaminophen (Tylenol) as it is already contained within these medications.   This medication is an opiate (or narcotic) pain medication and can be habit forming.  Use it as little as possible to achieve adequate pain control.  Do not use or use it with extreme caution if you have a history of opiate abuse or dependence.  If you are on a pain contract with your primary care doctor or a pain specialist, be sure to let them know you were prescribed this medication today from the [facility] Emergency Department.  This medication is intended for your use only - do not give any to anyone else and keep it in a secure place where nobody else, especially children, have access to it.  It will also cause or worsen constipation, so you may  want to consider taking an over-the-counter stool softener while you are taking this medication.  Return to the ED if your abdominal pain worsens or fails to improve, you develop bloody vomiting, bloody diarrhea, you are unable to tolerate fluids due to vomiting, fever greater than 101, or other symptoms that concern you.

## 2020-07-10 ENCOUNTER — Other Ambulatory Visit: Payer: Self-pay

## 2020-07-10 ENCOUNTER — Ambulatory Visit: Payer: Self-pay | Admitting: General Surgery

## 2020-07-10 ENCOUNTER — Other Ambulatory Visit
Admission: RE | Admit: 2020-07-10 | Discharge: 2020-07-10 | Disposition: A | Discharge status: Home / Self Care | Payer: Medicaid Other | Source: Ambulatory Visit | Attending: General Surgery | Admitting: General Surgery

## 2020-07-10 DIAGNOSIS — Z01812 Encounter for preprocedural laboratory examination: Secondary | ICD-10-CM | POA: Insufficient documentation

## 2020-07-10 DIAGNOSIS — Z20822 Contact with and (suspected) exposure to covid-19: Secondary | ICD-10-CM | POA: Insufficient documentation

## 2020-07-11 LAB — SARS CORONAVIRUS 2 (TAT 6-24 HRS): SARS Coronavirus 2: NEGATIVE

## 2020-07-13 ENCOUNTER — Ambulatory Visit: Payer: Medicaid Other

## 2020-07-13 ENCOUNTER — Other Ambulatory Visit: Payer: Self-pay

## 2020-07-13 ENCOUNTER — Ambulatory Visit: Payer: Self-pay | Admitting: General Surgery

## 2020-07-13 ENCOUNTER — Ambulatory Visit
Admission: RE | Admit: 2020-07-13 | Discharge: 2020-07-13 | Disposition: A | Discharge status: Home / Self Care | Payer: Medicaid Other | Attending: General Surgery | Admitting: General Surgery

## 2020-07-13 ENCOUNTER — Encounter
Admission: RE | Disposition: A | Discharge status: Home / Self Care | Payer: Self-pay | Source: Home / Self Care | Attending: General Surgery

## 2020-07-13 ENCOUNTER — Encounter: Payer: Self-pay | Admitting: General Surgery

## 2020-07-13 DIAGNOSIS — K8010 Calculus of gallbladder with chronic cholecystitis without obstruction: Secondary | ICD-10-CM | POA: Diagnosis not present

## 2020-07-13 DIAGNOSIS — K8020 Calculus of gallbladder without cholecystitis without obstruction: Secondary | ICD-10-CM | POA: Diagnosis present

## 2020-07-13 LAB — POCT PREGNANCY, URINE: Preg Test, Ur: NEGATIVE

## 2020-07-13 SURGERY — CHOLECYSTECTOMY, ROBOT-ASSISTED, LAPAROSCOPIC
Anesthesia: General | Site: Abdomen

## 2020-07-13 MED ORDER — MIDAZOLAM HCL 2 MG/2ML IJ SOLN
INTRAMUSCULAR | Status: DC | PRN
  Administered 2020-07-13: 2 mg via INTRAVENOUS

## 2020-07-13 MED ORDER — CHLORHEXIDINE GLUCONATE 0.12 % MT SOLN: 15.0000 mL | Freq: Once | OROMUCOSAL | Status: AC

## 2020-07-13 MED ORDER — MEPERIDINE HCL 50 MG/ML IJ SOLN
6.2500 mg | INTRAMUSCULAR | Status: DC | PRN
Start: 2020-07-13 — End: 2020-07-13

## 2020-07-13 MED ORDER — DEXAMETHASONE SODIUM PHOSPHATE 10 MG/ML IJ SOLN
INTRAMUSCULAR | Status: AC
  Filled 2020-07-13: qty 1

## 2020-07-13 MED ORDER — PROPOFOL 10 MG/ML IV BOLUS
INTRAVENOUS | Status: DC | PRN
  Administered 2020-07-13: 150 mg via INTRAVENOUS
  Administered 2020-07-13: 50 mg via INTRAVENOUS

## 2020-07-13 MED ORDER — LIDOCAINE HCL (CARDIAC) PF 100 MG/5ML IV SOSY
PREFILLED_SYRINGE | INTRAVENOUS | Status: DC | PRN
  Administered 2020-07-13: 100 mg via INTRAVENOUS

## 2020-07-13 MED ORDER — LACTATED RINGERS IV SOLN: INTRAVENOUS | Status: DC

## 2020-07-13 MED ORDER — OXYCODONE HCL 5 MG/5ML PO SOLN
5.0000 mg | Freq: Once | ORAL | Status: DC | PRN
Start: 2020-07-13 — End: 2020-07-13

## 2020-07-13 MED ORDER — PROPOFOL 10 MG/ML IV BOLUS
INTRAVENOUS | Status: AC
  Filled 2020-07-13: qty 20

## 2020-07-13 MED ORDER — HYDROMORPHONE HCL 1 MG/ML IJ SOLN
0.2500 mg | INTRAMUSCULAR | Status: DC | PRN
  Administered 2020-07-13 (×2): 0.5 mg via INTRAVENOUS

## 2020-07-13 MED ORDER — CEFAZOLIN SODIUM-DEXTROSE 2-4 GM/100ML-% IV SOLN
INTRAVENOUS | Status: AC
  Filled 2020-07-13: qty 100

## 2020-07-13 MED ORDER — FENTANYL CITRATE (PF) 100 MCG/2ML IJ SOLN
INTRAMUSCULAR | Status: AC
  Filled 2020-07-13: qty 2

## 2020-07-13 MED ORDER — FENTANYL CITRATE (PF) 100 MCG/2ML IJ SOLN
25.0000 ug | INTRAMUSCULAR | Status: DC | PRN
  Administered 2020-07-13: 25 ug via INTRAVENOUS

## 2020-07-13 MED ORDER — ACETAMINOPHEN 10 MG/ML IV SOLN
INTRAVENOUS | Status: DC | PRN
  Administered 2020-07-13: 1000 mg via INTRAVENOUS

## 2020-07-13 MED ORDER — CEFAZOLIN SODIUM-DEXTROSE 2-4 GM/100ML-% IV SOLN
2.0000 g | INTRAVENOUS | Status: AC
  Administered 2020-07-13: 2 g via INTRAVENOUS

## 2020-07-13 MED ORDER — FENTANYL CITRATE (PF) 100 MCG/2ML IJ SOLN
INTRAMUSCULAR | Status: DC | PRN
  Administered 2020-07-13: 25 ug via INTRAVENOUS
  Administered 2020-07-13: 50 ug via INTRAVENOUS
  Administered 2020-07-13: 25 ug via INTRAVENOUS
  Administered 2020-07-13 (×2): 50 ug via INTRAVENOUS
  Administered 2020-07-13: 25 ug via INTRAVENOUS

## 2020-07-13 MED ORDER — FENTANYL CITRATE (PF) 100 MCG/2ML IJ SOLN
INTRAMUSCULAR | Status: AC
  Administered 2020-07-13: 25 ug via INTRAVENOUS
  Filled 2020-07-13: qty 2

## 2020-07-13 MED ORDER — HYDROMORPHONE HCL 1 MG/ML IJ SOLN
INTRAMUSCULAR | Status: AC
  Filled 2020-07-13: qty 1

## 2020-07-13 MED ORDER — LORAZEPAM 2 MG/ML IJ SOLN: 1.0000 mg | Freq: Once | INTRAMUSCULAR | Status: DC | PRN

## 2020-07-13 MED ORDER — BUPIVACAINE-EPINEPHRINE (PF) 0.25% -1:200000 IJ SOLN
INTRAMUSCULAR | Status: AC
  Filled 2020-07-13: qty 30

## 2020-07-13 MED ORDER — ONDANSETRON HCL 4 MG/2ML IJ SOLN
INTRAMUSCULAR | Status: DC | PRN
  Administered 2020-07-13: 4 mg via INTRAVENOUS

## 2020-07-13 MED ORDER — DEXAMETHASONE SODIUM PHOSPHATE 10 MG/ML IJ SOLN
INTRAMUSCULAR | Status: DC | PRN
  Administered 2020-07-13: 10 mg via INTRAVENOUS

## 2020-07-13 MED ORDER — ORAL CARE MOUTH RINSE: 15.0000 mL | Freq: Once | OROMUCOSAL | Status: AC

## 2020-07-13 MED ORDER — CHLORHEXIDINE GLUCONATE 0.12 % MT SOLN
OROMUCOSAL | Status: AC
  Administered 2020-07-13: 15 mL via OROMUCOSAL
  Filled 2020-07-13: qty 15

## 2020-07-13 MED ORDER — ONDANSETRON HCL 4 MG/2ML IJ SOLN
INTRAMUSCULAR | Status: AC
  Filled 2020-07-13: qty 2

## 2020-07-13 MED ORDER — ACETAMINOPHEN 10 MG/ML IV SOLN
INTRAVENOUS | Status: AC
  Filled 2020-07-13: qty 100

## 2020-07-13 MED ORDER — ROCURONIUM BROMIDE 100 MG/10ML IV SOLN
INTRAVENOUS | Status: DC | PRN
  Administered 2020-07-13: 50 mg via INTRAVENOUS
  Administered 2020-07-13: 20 mg via INTRAVENOUS

## 2020-07-13 MED ORDER — MIDAZOLAM HCL 2 MG/2ML IJ SOLN
INTRAMUSCULAR | Status: AC
  Filled 2020-07-13: qty 2

## 2020-07-13 MED ORDER — OXYCODONE-ACETAMINOPHEN 7.5-325 MG PO TABS: 1.0000 | ORAL_TABLET | ORAL | Status: DC | PRN

## 2020-07-13 MED ORDER — PROMETHAZINE HCL 25 MG/ML IJ SOLN: 6.2500 mg | INTRAMUSCULAR | Status: DC | PRN

## 2020-07-13 MED ORDER — OXYCODONE HCL 5 MG PO TABS: 5.0000 mg | ORAL_TABLET | Freq: Once | ORAL | Status: DC | PRN

## 2020-07-13 MED ORDER — OXYCODONE-ACETAMINOPHEN 7.5-325 MG PO TABS
ORAL_TABLET | ORAL | Status: AC
  Administered 2020-07-13: 1 via ORAL
  Filled 2020-07-13: qty 1

## 2020-07-13 MED ORDER — SUGAMMADEX SODIUM 200 MG/2ML IV SOLN
INTRAVENOUS | Status: DC | PRN
  Administered 2020-07-13: 100 mg via INTRAVENOUS

## 2020-07-13 MED ORDER — FIBRIN SEALANT 2 ML SINGLE DOSE KIT
PACK | CUTANEOUS | Status: DC | PRN
  Administered 2020-07-13: 2 mL via TOPICAL

## 2020-07-13 MED ORDER — HYDROMORPHONE HCL 1 MG/ML IJ SOLN
INTRAMUSCULAR | Status: AC
  Administered 2020-07-13: 0.5 mg via INTRAVENOUS
  Filled 2020-07-13: qty 1

## 2020-07-13 MED ORDER — INDOCYANINE GREEN 25 MG IV SOLR
1.2500 mg | Freq: Once | INTRAVENOUS | Status: AC
  Administered 2020-07-13: 1.25 mg via INTRAVENOUS
  Filled 2020-07-13: qty 0.5

## 2020-07-13 MED ORDER — DROPERIDOL 2.5 MG/ML IJ SOLN
0.6250 mg | Freq: Once | INTRAMUSCULAR | Status: AC | PRN
  Administered 2020-07-13: 0.625 mg via INTRAVENOUS
  Filled 2020-07-13 (×2): qty 2

## 2020-07-13 MED ORDER — DEXMEDETOMIDINE (PRECEDEX) IN NS 20 MCG/5ML (4 MCG/ML) IV SYRINGE
PREFILLED_SYRINGE | INTRAVENOUS | Status: DC | PRN
  Administered 2020-07-13 (×3): 4 ug via INTRAVENOUS
  Administered 2020-07-13: 8 ug via INTRAVENOUS

## 2020-07-13 MED ORDER — OXYCODONE-ACETAMINOPHEN 7.5-325 MG PO TABS: 1.0000 | ORAL_TABLET | ORAL | 0 refills | Status: AC | PRN

## 2020-07-13 MED ORDER — BUPIVACAINE-EPINEPHRINE 0.25% -1:200000 IJ SOLN
INTRAMUSCULAR | Status: DC | PRN
  Administered 2020-07-13: 30 mL

## 2020-07-13 SURGICAL SUPPLY — 55 items
ADH SKN CLS APL DERMABOND .7 (GAUZE/BANDAGES/DRESSINGS) ×2
APL LAPSCP 35 DL APL RGD (MISCELLANEOUS) ×2
APL PRP STRL LF DISP 70% ISPRP (MISCELLANEOUS) ×2
APPLICATOR VISTASEAL 35 (MISCELLANEOUS) ×1 IMPLANT
BAG INFUSER PRESSURE 100CC (MISCELLANEOUS) IMPLANT
BAG SPEC RTRVL LRG 6X4 10 (ENDOMECHANICALS) ×2
BLADE SURG SZ11 CARB STEEL (BLADE) ×3 IMPLANT
CANISTER SUCT 1200ML W/VALVE (MISCELLANEOUS) ×3 IMPLANT
CANNULA REDUC XI 12-8 STAPL (CANNULA) ×1
CANNULA REDUCER 12-8 DVNC XI (CANNULA) ×2 IMPLANT
CHLORAPREP W/TINT 26 (MISCELLANEOUS) ×3 IMPLANT
CLIP VESOLOCK MED LG 6/CT (CLIP) ×3 IMPLANT
COVER WAND RF STERILE (DRAPES) ×3 IMPLANT
DECANTER SPIKE VIAL GLASS SM (MISCELLANEOUS) ×6 IMPLANT
DEFOGGER SCOPE WARMER CLEARIFY (MISCELLANEOUS) ×3 IMPLANT
DERMABOND ADVANCED (GAUZE/BANDAGES/DRESSINGS) ×1
DERMABOND ADVANCED .7 DNX12 (GAUZE/BANDAGES/DRESSINGS) ×2 IMPLANT
DRAPE ARM DVNC X/XI (DISPOSABLE) ×8 IMPLANT
DRAPE COLUMN DVNC XI (DISPOSABLE) ×2 IMPLANT
DRAPE DA VINCI XI ARM (DISPOSABLE) ×4
DRAPE DA VINCI XI COLUMN (DISPOSABLE) ×1
ELECT REM PT RETURN 9FT ADLT (ELECTROSURGICAL) ×3
ELECTRODE REM PT RTRN 9FT ADLT (ELECTROSURGICAL) ×2 IMPLANT
GLOVE SURG ENC MOIS LTX SZ6.5 (GLOVE) ×6 IMPLANT
GLOVE SURG UNDER POLY LF SZ6.5 (GLOVE) ×6 IMPLANT
GOWN STRL REUS W/ TWL LRG LVL3 (GOWN DISPOSABLE) ×6 IMPLANT
GOWN STRL REUS W/TWL LRG LVL3 (GOWN DISPOSABLE) ×9
GRASPER SUT TROCAR 14GX15 (MISCELLANEOUS) ×1 IMPLANT
IRRIGATOR SUCT 8 DISP DVNC XI (IRRIGATION / IRRIGATOR) IMPLANT
IRRIGATOR SUCTION 8MM XI DISP (IRRIGATION / IRRIGATOR)
IV NS 1000ML (IV SOLUTION)
IV NS 1000ML BAXH (IV SOLUTION) IMPLANT
KIT PINK PAD W/HEAD ARE REST (MISCELLANEOUS) ×3
KIT PINK PAD W/HEAD ARM REST (MISCELLANEOUS) ×2 IMPLANT
LABEL OR SOLS (LABEL) ×3 IMPLANT
MANIFOLD NEPTUNE II (INSTRUMENTS) ×3 IMPLANT
NDL INSUFFLATION 14GA 120MM (NEEDLE) ×2 IMPLANT
NEEDLE HYPO 22GX1.5 SAFETY (NEEDLE) ×3 IMPLANT
NEEDLE INSUFFLATION 14GA 120MM (NEEDLE) ×3 IMPLANT
NS IRRIG 500ML POUR BTL (IV SOLUTION) ×3 IMPLANT
OBTURATOR OPTICAL STANDARD 8MM (TROCAR) ×1
OBTURATOR OPTICAL STND 8 DVNC (TROCAR) ×2
OBTURATOR OPTICALSTD 8 DVNC (TROCAR) ×2 IMPLANT
PACK LAP CHOLECYSTECTOMY (MISCELLANEOUS) ×3 IMPLANT
POUCH SPECIMEN RETRIEVAL 10MM (ENDOMECHANICALS) ×3 IMPLANT
SEAL CANN UNIV 5-8 DVNC XI (MISCELLANEOUS) ×6 IMPLANT
SEAL XI 5MM-8MM UNIVERSAL (MISCELLANEOUS) ×3
SET TUBE SMOKE EVAC HIGH FLOW (TUBING) ×3 IMPLANT
SOLUTION ELECTROLUBE (MISCELLANEOUS) ×3 IMPLANT
STAPLER CANNULA SEAL DVNC XI (STAPLE) ×2 IMPLANT
STAPLER CANNULA SEAL XI (STAPLE) ×1
SUT MNCRL 4-0 (SUTURE) ×6
SUT MNCRL 4-0 27XMFL (SUTURE) ×4
SUT VICRYL 0 AB UR-6 (SUTURE) ×1 IMPLANT
SUTURE MNCRL 4-0 27XMF (SUTURE) ×2 IMPLANT

## 2020-07-13 NOTE — Anesthesia Procedure Notes (Addendum)
Procedure Name: Intubation Date/Time: 07/13/2020 12:51 PM Performed by: Margaret Pyle, RN Pre-anesthesia Checklist: Patient identified, Patient being monitored, Timeout performed, Emergency Drugs available and Suction available Patient Re-evaluated:Patient Re-evaluated prior to induction Oxygen Delivery Method: Circle system utilized Preoxygenation: Pre-oxygenation with 100% oxygen Induction Type: IV induction Ventilation: Mask ventilation without difficulty Laryngoscope Size: 3 and McGraph Grade View: Grade I Tube type: Oral Tube size: 7.0 mm Number of attempts: 1 Airway Equipment and Method: Stylet Placement Confirmation: ETT inserted through vocal cords under direct vision,  positive ETCO2 and breath sounds checked- equal and bilateral Secured at: 21 cm Tube secured with: Tape Dental Injury: Teeth and Oropharynx as per pre-operative assessment  Comments: Performed by Randon Goldsmith, SRNA under direct supervision by Dr. Deeann Cree and Karoline Caldwell, CRNA

## 2020-07-13 NOTE — H&P (Signed)
PATIENT PROFILE: Kelly Zhang is a 20 y.o. female who presents to the Clinic for consultation at the request of Dr. Clarita Leber for evaluation of cholelithiasis.  PCP:  None  HISTORY OF PRESENT ILLNESS: Kelly Zhang reports having abdominal pain since 2 months ago.  The pain is in the right upper quadrant.  The pain radiates to her back.  The pain is exacerbated by oral intake.  The pain is getting more frequent.  Hydrocodone is not controlling the pain.  She denies any fever or chills.  She has gone to the emergency room for this pain and she was discharged with diagnosis of cholelithiasis.   PROBLEM LIST: Cholelithiasis  GENERAL REVIEW OF SYSTEMS:   General ROS: negative for - chills, fatigue, fever, weight gain or weight loss Allergy and Immunology ROS: negative for - hives  Hematological and Lymphatic ROS: negative for - bleeding problems or bruising, negative for palpable nodes Endocrine ROS: negative for - heat or cold intolerance, hair changes Respiratory ROS: negative for - cough, shortness of breath or wheezing Cardiovascular ROS: no chest pain or palpitations GI ROS: negative for nausea, vomiting, diarrhea, constipation.  Positive for abdominal pain Musculoskeletal ROS: negative for - joint swelling or muscle pain Neurological ROS: negative for - confusion, syncope Dermatological ROS: negative for pruritus and rash Psychiatric: negative for anxiety, depression, difficulty sleeping and memory loss  MEDICATIONS: Current Medications        Current Outpatient Medications  Medication Sig Dispense Refill  . dicyclomine (BENTYL) 10 mg capsule     . HYDROcodone-acetaminophen (NORCO) 5-325 mg tablet TAKE 2 TABLETS BY MOUTH EVERY 6 HOURS AS NEEDED FOR MODERATE PAIN OR SEVERE PAIN     No current facility-administered medications for this visit.      ALLERGIES: Patient has no known allergies.  PAST MEDICAL HISTORY: No past medical history on file.  PAST SURGICAL  HISTORY: No past surgical history on file.   FAMILY HISTORY: No family history on file.   SOCIAL HISTORY: Social History          Socioeconomic History  . Marital status: Single    Spouse name: Not on file  . Number of children: Not on file  . Years of education: Not on file  . Highest education level: Not on file  Occupational History  . Not on file  Tobacco Use  . Smoking status: Never Smoker  . Smokeless tobacco: Not on file  Substance and Sexual Activity  . Alcohol use: Not on file  . Drug use: Not on file  . Sexual activity: Not on file  Other Topics Concern  . Not on file  Social History Narrative  . Not on file   Social Determinants of Health   Financial Resource Strain: Not on file  Food Insecurity: Not on file  Transportation Needs: Not on file      PHYSICAL EXAM:    Vitals:   07/10/20 0957  BP: 136/77  Pulse: 75   Body mass index is 31.89 kg/m. Weight: 81.6 kg (180 lb)   GENERAL: Alert, active, oriented x3  HEENT: Pupils equal reactive to light. Extraocular movements are intact. Sclera clear. Palpebral conjunctiva normal red color.Pharynx clear.  NECK: Supple with no palpable mass and no adenopathy.  LUNGS: Sound clear with no rales rhonchi or wheezes.  HEART: Regular rhythm S1 and S2 without murmur.  ABDOMEN: Soft and depressible, nontender with no palpable mass, no hepatomegaly.   EXTREMITIES: Well-developed well-nourished symmetrical with no dependent edema.  NEUROLOGICAL: Awake  alert oriented, facial expression symmetrical, moving all extremities.  REVIEW OF DATA: I have reviewed the following data today: No results found for any previous visit.     ASSESSMENT: Kelly Zhang is a 20 y.o. female presenting for consultation for cholelithiasis.    Patient was oriented about the diagnosis of cholelithiasis. Also oriented about what is the gallbladder, its anatomy and function and the implications of having stones.  The patient was oriented about the treatment alternatives (observation vs cholecystectomy). Patient was oriented that a low percentage of patient will continue to have similar pain symptoms even after the gallbladder is removed. Surgical technique (open vs laparoscopic) was discussed. It was also discussed the goals of the surgery (decrease the pain episodes and avoid the risk of cholecystitis) and the risk of surgery including: bleeding, infection, common bile duct injury, stone retention, injury to other organs such as bowel, liver, stomach, other complications such as hernia, bowel obstruction among others. Also discussed with patient about anesthesia and its complications such as: reaction to medications, pneumonia, heart complications, death, among others.  And try to expedite the process of permission for surgical management with this patient with recurrent biliary colic's.  I am hoping to treat her biliary colic before became cholecystitis and patient ended up in the hospital.  We will try to contact her insurance company to see if they understand the urgency and the necessity of the surgery.  Patient reports he understood and agreed to proceed with surgical management.  Cholelithiasis without cholecystitis [K80.20]  PLAN: 1. Robotic assisted laparoscopic cholecystectomy (72620) 2.  CBC, CMP (done) 3.  Do not take aspirin 5 days before the procedure 4.  Contact us if has any question or concern.  Patient verbalized understanding, all questions were answered, and were agreeable with the plan outlined above.     Carolan Shiver, MD  Electronically signed by Carolan Shiver, MD

## 2020-07-13 NOTE — Anesthesia Preprocedure Evaluation (Signed)
Anesthesia Evaluation  Patient identified by MRN, date of birth, ID band Patient awake    Reviewed: Allergy & Precautions, H&P , NPO status , Patient's Chart, lab work & pertinent test results  Airway Mallampati: II  TM Distance: >3 FB     Dental  (+) Teeth Intact, Poor Dentition   Pulmonary neg pulmonary ROS,    Pulmonary exam normal        Cardiovascular negative cardio ROS Normal cardiovascular exam     Neuro/Psych negative neurological ROS  negative psych ROS   GI/Hepatic negative GI ROS, Neg liver ROS,   Endo/Other  negative endocrine ROS  Renal/GU negative Renal ROS  negative genitourinary   Musculoskeletal negative musculoskeletal ROS (+)   Abdominal   Peds negative pediatric ROS (+)  Hematology negative hematology ROS (+)   Anesthesia Other Findings Past Medical History: 06/06/2020: Cholelithiasis   Reproductive/Obstetrics negative OB ROS                             Anesthesia Physical Anesthesia Plan  ASA: II  Anesthesia Plan: General   Post-op Pain Management:    Induction: Intravenous  PONV Risk Score and Plan: 3 and Ondansetron and Dexamethasone  Airway Management Planned: Oral ETT  Additional Equipment:   Intra-op Plan:   Post-operative Plan: Extubation in OR  Informed Consent: I have reviewed the patients History and Physical, chart, labs and discussed the procedure including the risks, benefits and alternatives for the proposed anesthesia with the patient or authorized representative who has indicated his/her understanding and acceptance.     Dental advisory given  Plan Discussed with: CRNA, Anesthesiologist and Surgeon  Anesthesia Plan Comments:         Anesthesia Quick Evaluation

## 2020-07-13 NOTE — Op Note (Signed)
Preoperative diagnosis: Symptomatic cholelithiasis  Postoperative diagnosis: Acute cholecystitis  Procedure: Robotic Assisted Laparoscopic Cholecystectomy.   Anesthesia: GETA   Surgeon: Dr. Hazle Quant  Wound Classification: Clean Contaminated  Indications: Patient is a 20 y.o. female developed right upper quadrant pain and on workup was found to have cholelithiasis with a normal common duct. Robotic Assisted Laparoscopic cholecystectomy was elected.  Findings: Obstruction of cystic duct identified Critical view of safety achieved   Cystic duct and artery identified, ligated and divided Adequate hemostasis  Description of procedure: The patient was placed on the operating table in the supine position. General anesthesia was induced. A time-out was completed verifying correct patient, procedure, site, positioning, and implant(s) and/or special equipment prior to beginning this procedure. An orogastric tube was placed. The abdomen was prepped and draped in the usual sterile fashion.  An incision was made in a natural skin line below the umbilicus.  The fascia was elevated and the Veress needle inserted. Proper position was confirmed by aspiration and saline meniscus test.  The abdomen was insufflated with carbon dioxide to a pressure of 15 mmHg. The patient tolerated insufflation well. A 8-mm trocar was then inserted in optiview fashion.  The laparoscope was inserted and the abdomen inspected. No injuries from initial trocar placement were noted. Additional trocars were then inserted in the following locations: an 8-mm trocar in the left lateral abdomen, and another two 8-mm trocars to the right side of the abdomen 5 cm appart. The umbilical trocar was changed to a 12 mm trocar all under direct visualization. The abdomen was inspected and no abnormalities were found. The table was placed in the reverse Trendelenburg position with the right side up. The robotic arms were docked and target  anatomy identified. Instrument inserted under direct visualization.  Filmy adhesions between the gallbladder and omentum, duodenum and transverse colon were lysed with electrocautery. The dome of the gallbladder was grasped with a prograsp and retracted over the dome of the liver. The infundibulum was also grasped with an atraumatic grasper and retracted toward the right lower quadrant. This maneuver exposed Calot's triangle. The peritoneum overlying the gallbladder infundibulum was then incised and the cystic duct and cystic artery identified and circumferentially dissected. Critical view of safety reviewed before ligating any structure. Firefly images taken to visualize biliary ducts. The cystic duct and cystic artery were then doubly clipped and divided close to the gallbladder.  The gallbladder was then dissected from its peritoneal attachments by electrocautery. Hemostasis was checked and the gallbladder and contained stones were removed using an endoscopic retrieval bag. The gallbladder was passed off the table as a specimen. Vistaseal irrigated on the cystic stump. There was no evidence of bleeding from the gallbladder fossa or cystic artery or leakage of the bile from the cystic duct stump. Secondary trocars were removed under direct vision. No bleeding was noted. The robotic arms were undoked. The scope was withdrawn and the umbilical trocar removed. The abdomen was allowed to collapse. The fascia of the 74mm trocar sites was closed with figure-of-eight 0 vicryl sutures. The skin was closed with subcuticular sutures of 4-0 monocryl and topical skin adhesive. The orogastric tube was removed.  The patient tolerated the procedure well and was taken to the postanesthesia care unit in stable condition.   Specimen: Gallbladder  Complications: None  EBL: 10 mL

## 2020-07-13 NOTE — Interval H&P Note (Signed)
History and Physical Interval Note:  07/13/2020 11:34 AM  Kelly Zhang  has presented today for surgery, with the diagnosis of K80.20 Cholelithiasis w/o cholecystitis.  The various methods of treatment have been discussed with the patient and family. After consideration of risks, benefits and other options for treatment, the patient has consented to  Procedure(s): XI ROBOTIC ASSISTED LAPAROSCOPIC CHOLECYSTECTOMY (N/A) as a surgical intervention.  The patient's history has been reviewed, patient examined, no change in status, stable for surgery.  I have reviewed the patient's chart and labs.  Questions were answered to the patient's satisfaction.     Carolan Shiver

## 2020-07-13 NOTE — Discharge Instructions (Signed)
  Diet: Resume home heart healthy regular diet.   Activity: No heavy lifting >20 pounds (children, pets, laundry, garbage) or strenuous activity until follow-up, but light activity and walking are encouraged. Do not drive or drink alcohol if taking narcotic pain medications.  Wound care: May shower with soapy water and pat dry (do not rub incisions), but no baths or submerging incision underwater until follow-up. (no swimming)   Medications: Resume all home medications. For mild to moderate pain: acetaminophen (Tylenol) or ibuprofen (if no kidney disease). Combining Tylenol with alcohol can substantially increase your risk of causing liver disease. Narcotic pain medications, if prescribed, can be used for severe pain, though may cause nausea, constipation, and drowsiness. Do not combine Tylenol and Norco within a 6 hour period as Norco contains Tylenol. If you do not need the narcotic pain medication, you do not need to fill the prescription.  Call office ([phone]) at any time if any questions, worsening pain, fevers/chills, bleeding, drainage from incision site, or other concerns.   AMBULATORY SURGERY  DISCHARGE INSTRUCTIONS   1) The drugs that you were given will stay in your system until tomorrow so for the next 24 hours you should not:  A) Drive an automobile B) Make any legal decisions C) Drink any alcoholic beverage   2) You may resume regular meals tomorrow.  Today it is better to start with liquids and gradually work up to solid foods.  You may eat anything you prefer, but it is better to start with liquids, then soup and crackers, and gradually work up to solid foods.   3) Please notify your doctor immediately if you have any unusual bleeding, trouble breathing, redness and pain at the surgery site, drainage, fever, or pain not relieved by medication. 4)   5) Your post-operative visit with Dr.                                     is: Date:                        Time:     Please call to schedule your post-operative visit.  6) Additional Instructions:  

## 2020-07-13 NOTE — Progress Notes (Signed)
pts fiance with car trouble , unable to take pt. Home . Pts roommate called to transport pt, home . Pt told roommate she was told she had to leave , however pt. Was never told to leave., she rested in recliner until transportation arrived , pt. Transported to the car in wheelchair.

## 2020-07-13 NOTE — Transfer of Care (Signed)
Immediate Anesthesia Transfer of Care Note  Patient: [facility]  Procedure(s) Performed: XI ROBOTIC ASSISTED LAPAROSCOPIC CHOLECYSTECTOMY (N/A Abdomen) INDOCYANINE GREEN FLUORESCENCE IMAGING (ICG)  Patient Location: PACU  Anesthesia Type:General  Level of Consciousness: drowsy  Airway & Oxygen Therapy: Patient Spontanous Breathing and Patient connected to face mask oxygen  Post-op Assessment: Report given to RN and Post -op Vital signs reviewed and stable  Post vital signs: Reviewed and stable  Last Vitals:  Vitals Value Taken Time  BP 145/92 07/13/20 1445  Temp 36.1 C 07/13/20 1441  Pulse 84 07/13/20 1446  Resp 5 07/13/20 1442  SpO2 100 % 07/13/20 1446  Vitals shown include unvalidated device data.  Last Pain:  Vitals:   07/13/20 1441  TempSrc:   PainSc: Asleep         Complications: No complications documented.

## 2020-07-13 NOTE — H&P (View-Only) (Signed)
Kelly Zhang PROFILE: Kelly Zhang is a 20 y.o. female who presents to the Clinic for consultation at the request of Dr. Clarita Leber for evaluation of cholelithiasis.  PCP:  None  HISTORY OF PRESENT ILLNESS: Kelly Zhang reports having abdominal pain since 2 months ago.  The pain is in the right upper quadrant.  The pain radiates to her back.  The pain is exacerbated by oral intake.  The pain is getting more frequent.  Hydrocodone is not controlling the pain.  She denies any fever or chills.  She has gone to the emergency room for this pain and she was discharged with diagnosis of cholelithiasis.   PROBLEM LIST: Cholelithiasis  GENERAL REVIEW OF SYSTEMS:   General ROS: negative for - chills, fatigue, fever, weight gain or weight loss Allergy and Immunology ROS: negative for - hives  Hematological and Lymphatic ROS: negative for - bleeding problems or bruising, negative for palpable nodes Endocrine ROS: negative for - heat or cold intolerance, hair changes Respiratory ROS: negative for - cough, shortness of breath or wheezing Cardiovascular ROS: no chest pain or palpitations GI ROS: negative for nausea, vomiting, diarrhea, constipation.  Positive for abdominal pain Musculoskeletal ROS: negative for - joint swelling or muscle pain Neurological ROS: negative for - confusion, syncope Dermatological ROS: negative for pruritus and rash Psychiatric: negative for anxiety, depression, difficulty sleeping and memory loss  MEDICATIONS: Current Medications        Current Outpatient Medications  Medication Sig Dispense Refill  . dicyclomine (BENTYL) 10 mg capsule     . HYDROcodone-acetaminophen (NORCO) 5-325 mg tablet TAKE 2 TABLETS BY MOUTH EVERY 6 HOURS AS NEEDED FOR MODERATE PAIN OR SEVERE PAIN     No current facility-administered medications for this visit.      ALLERGIES: Kelly Zhang has no known allergies.  PAST MEDICAL HISTORY: No past medical history on file.  PAST SURGICAL  HISTORY: No past surgical history on file.   FAMILY HISTORY: No family history on file.   SOCIAL HISTORY: Social History          Socioeconomic History  . Marital status: Single    Spouse name: Not on file  . Number of children: Not on file  . Years of education: Not on file  . Highest education level: Not on file  Occupational History  . Not on file  Tobacco Use  . Smoking status: Never Smoker  . Smokeless tobacco: Not on file  Substance and Sexual Activity  . Alcohol use: Not on file  . Drug use: Not on file  . Sexual activity: Not on file  Other Topics Concern  . Not on file  Social History Narrative  . Not on file   Social Determinants of Health   Financial Resource Strain: Not on file  Food Insecurity: Not on file  Transportation Needs: Not on file      PHYSICAL EXAM:    Vitals:   07/10/20 0957  BP: 136/77  Pulse: 75   Body mass index is 31.89 kg/m. Weight: 81.6 kg (180 lb)   GENERAL: Alert, active, oriented x3  HEENT: Pupils equal reactive to light. Extraocular movements are intact. Sclera clear. Palpebral conjunctiva normal red color.Pharynx clear.  NECK: Supple with no palpable mass and no adenopathy.  LUNGS: Sound clear with no rales rhonchi or wheezes.  HEART: Regular rhythm S1 and S2 without murmur.  ABDOMEN: Soft and depressible, nontender with no palpable mass, no hepatomegaly.   EXTREMITIES: Well-developed well-nourished symmetrical with no dependent edema.  NEUROLOGICAL: Awake  alert oriented, facial expression symmetrical, moving all extremities.  REVIEW OF DATA: I have reviewed the following data today: No results found for any previous visit.     ASSESSMENT: Kelly Zhang is a 20 y.o. female presenting for consultation for cholelithiasis.    Kelly Zhang was oriented about the diagnosis of cholelithiasis. Also oriented about what is the gallbladder, its anatomy and function and the implications of having stones.  The Kelly Zhang was oriented about the treatment alternatives (observation vs cholecystectomy). Kelly Zhang was oriented that a low percentage of Kelly Zhang will continue to have similar pain symptoms even after the gallbladder is removed. Surgical technique (open vs laparoscopic) was discussed. It was also discussed the goals of the surgery (decrease the pain episodes and avoid the risk of cholecystitis) and the risk of surgery including: bleeding, infection, common bile duct injury, stone retention, injury to other organs such as bowel, liver, stomach, other complications such as hernia, bowel obstruction among others. Also discussed with Kelly Zhang about anesthesia and its complications such as: reaction to medications, pneumonia, heart complications, death, among others.  And try to expedite the process of permission for surgical management with this Kelly Zhang with recurrent biliary colic's.  I am hoping to treat her biliary colic before became cholecystitis and Kelly Zhang ended up in the hospital.  We will try to contact her insurance company to see if they understand the urgency and the necessity of the surgery.  Kelly Zhang reports he understood and agreed to proceed with surgical management.  Cholelithiasis without cholecystitis [K80.20]  PLAN: 1. Robotic assisted laparoscopic cholecystectomy (47562) 2.  CBC, CMP (done) 3.  Do not take aspirin 5 days before the procedure 4.  Contact us if has any question or concern.  Kelly Zhang verbalized understanding, all questions were answered, and were agreeable with the plan outlined above.     Daziya Redmond Cintron-Diaz, MD  Electronically signed by Frimy Uffelman Cintron-Diaz, MD  

## 2020-07-14 NOTE — Anesthesia Postprocedure Evaluation (Signed)
Anesthesia Post Note  Patient: [employer]  Procedure(s) Performed: XI ROBOTIC ASSISTED LAPAROSCOPIC CHOLECYSTECTOMY (N/A Abdomen) INDOCYANINE GREEN FLUORESCENCE IMAGING (ICG)  Patient location during evaluation: PACU Anesthesia Type: General Level of consciousness: awake Pain management: pain level controlled Vital Signs Assessment: post-procedure vital signs reviewed and stable Respiratory status: spontaneous breathing Cardiovascular status: stable Postop Assessment: no apparent nausea or vomiting Anesthetic complications: no   No complications documented.   Last Vitals:  Vitals:   07/13/20 1610 07/13/20 1726  BP:  136/84  Pulse: 83   Resp:  16  Temp:  (!) 36.1 C  SpO2: 100% 100%    Last Pain:  Vitals:   07/13/20 1726  TempSrc:   PainSc: 4                  Emilio Math

## 2020-07-15 LAB — SURGICAL PATHOLOGY

## 2022-07-27 IMAGING — US US ABDOMEN LIMITED
1 series · 14 of 25 positions shown · non-contrast
Comparison: None.

CLINICAL DATA: Right upper quadrant pain.

EXAM:
ULTRASOUND ABDOMEN LIMITED RIGHT UPPER QUADRANT

[Series 1: us abdomen limited ruq (liver/gb) · 14 of 42 slices shown]
[im 1/42]
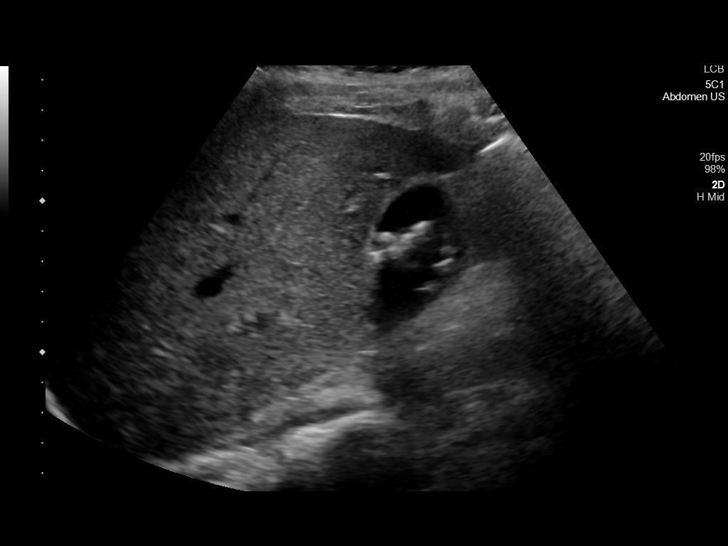
[im 4/42]
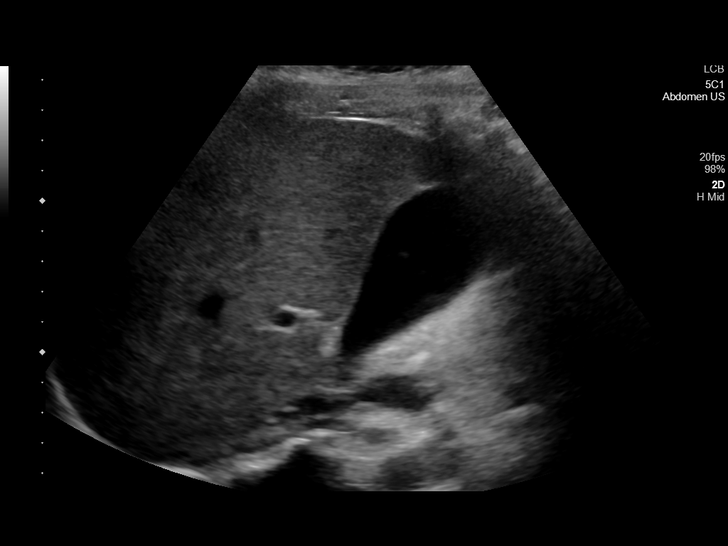
[im 7/42]
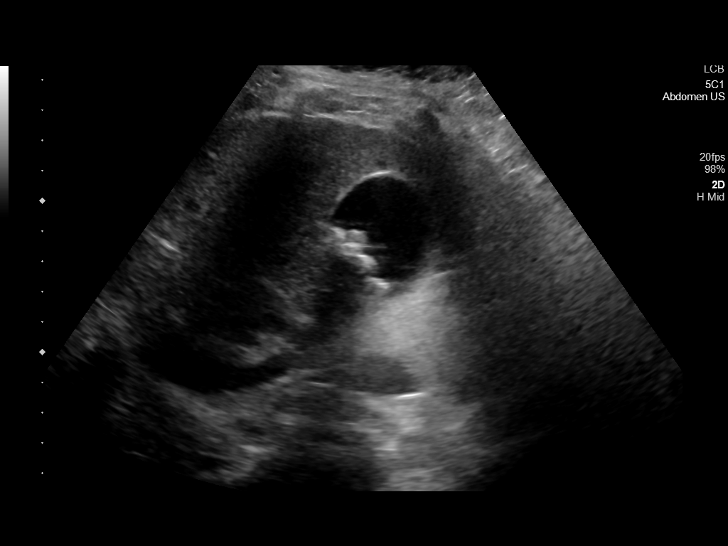
[im 11/42]
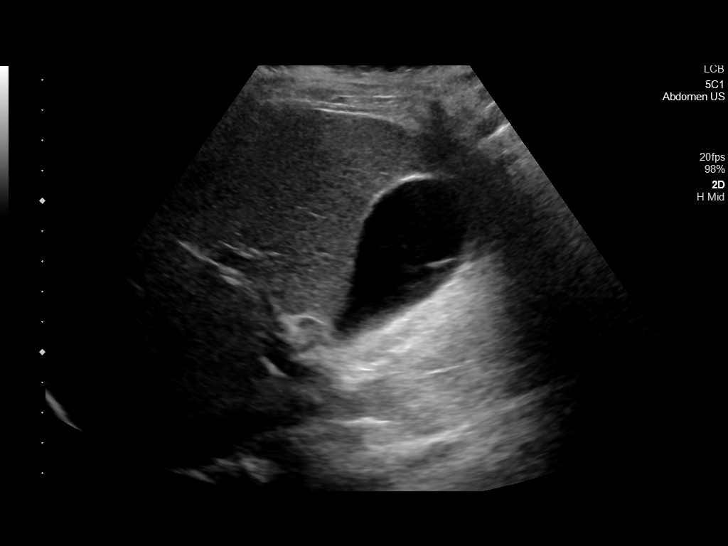
[im 14/42]
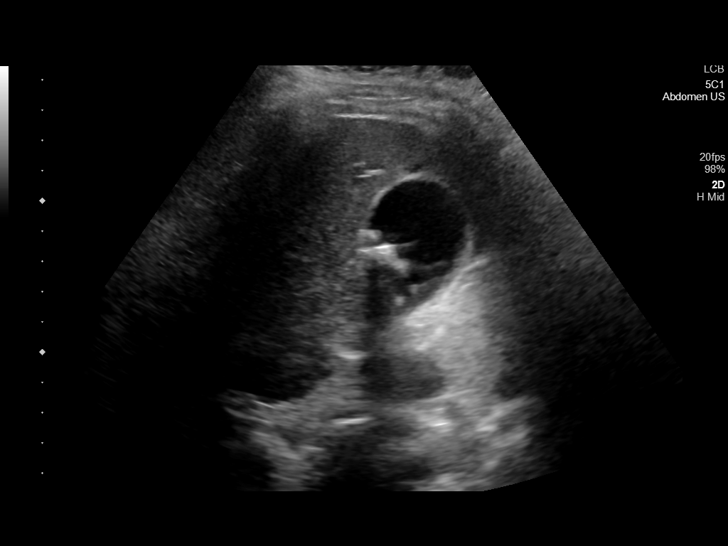
[im 16/42]
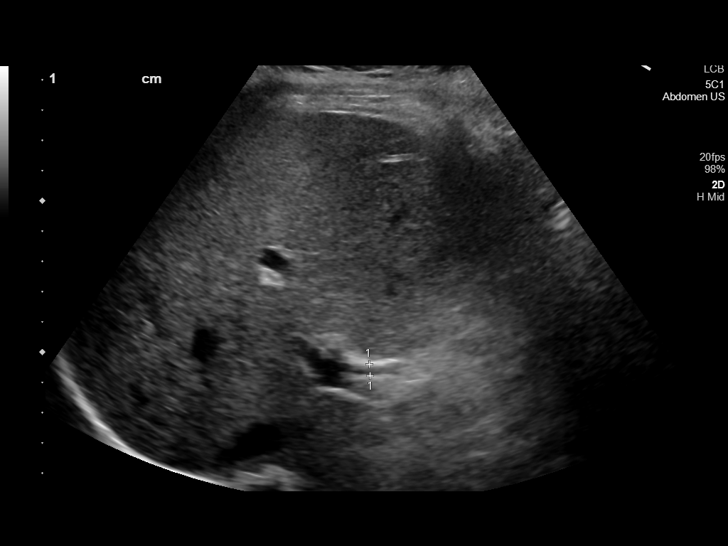
[im 19/42]
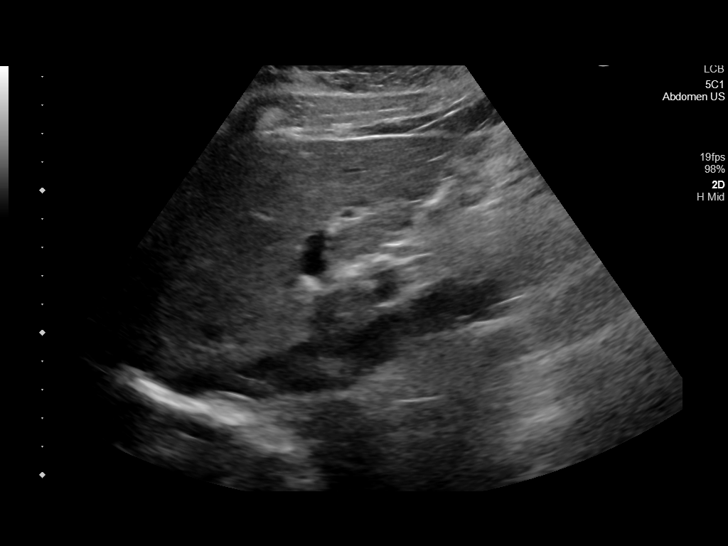
[im 23/42]
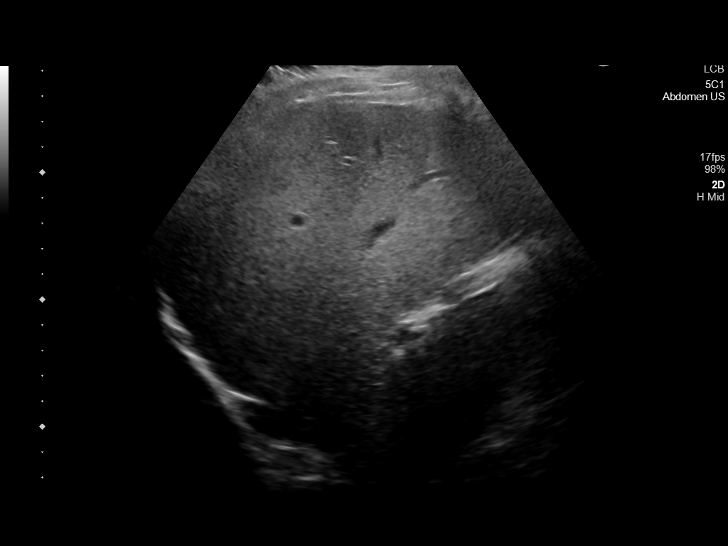
[im 26/42]
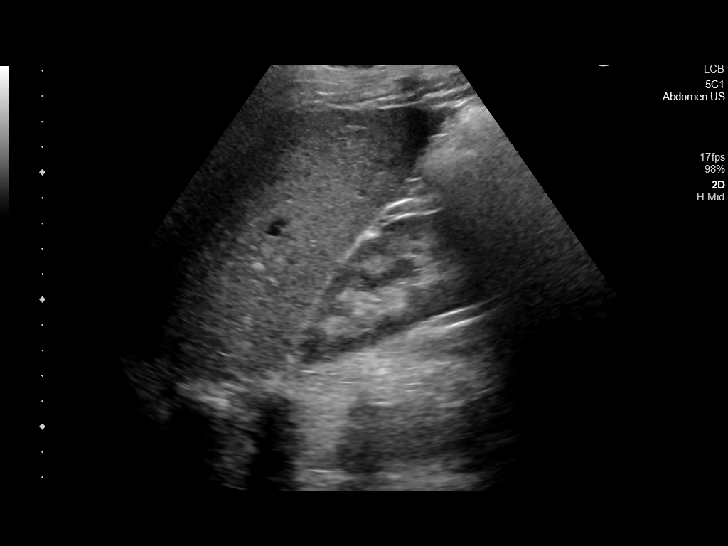
[im 28/42]
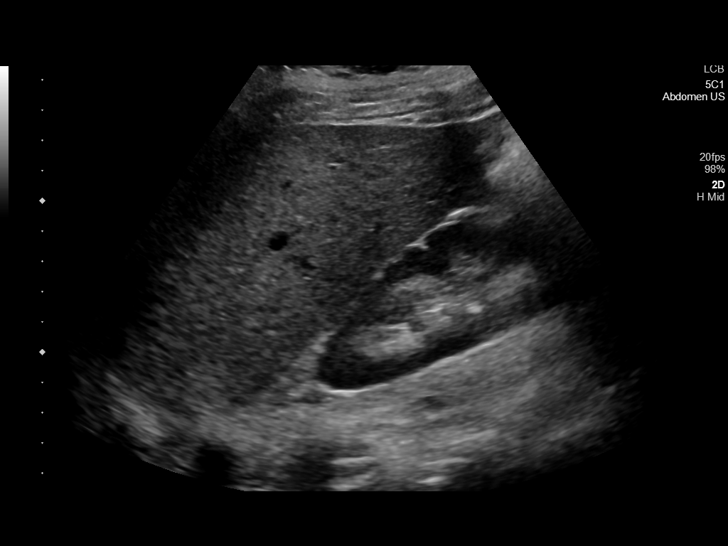
[im 31/42]
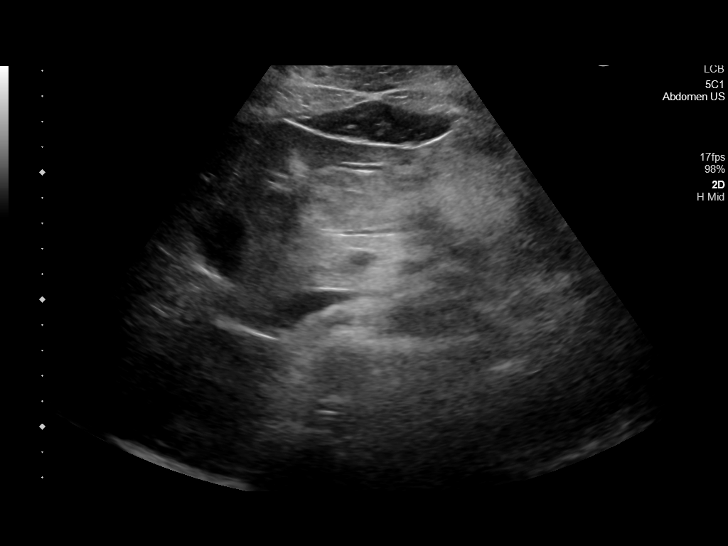
[im 35/42]
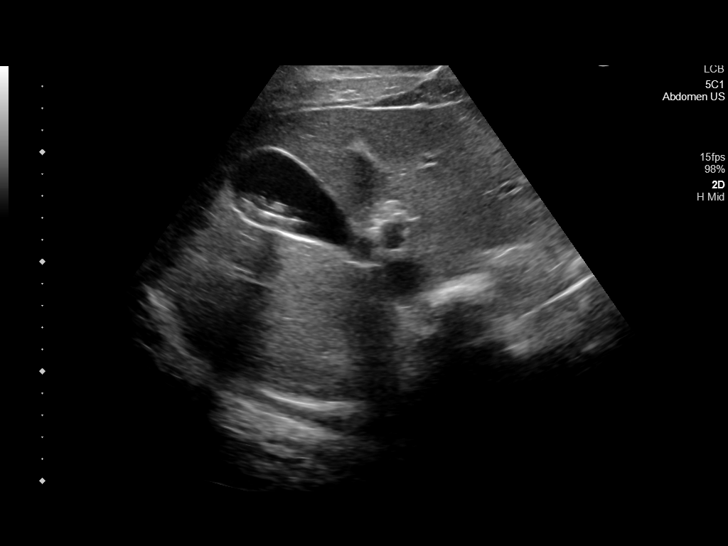
[im 38/42]
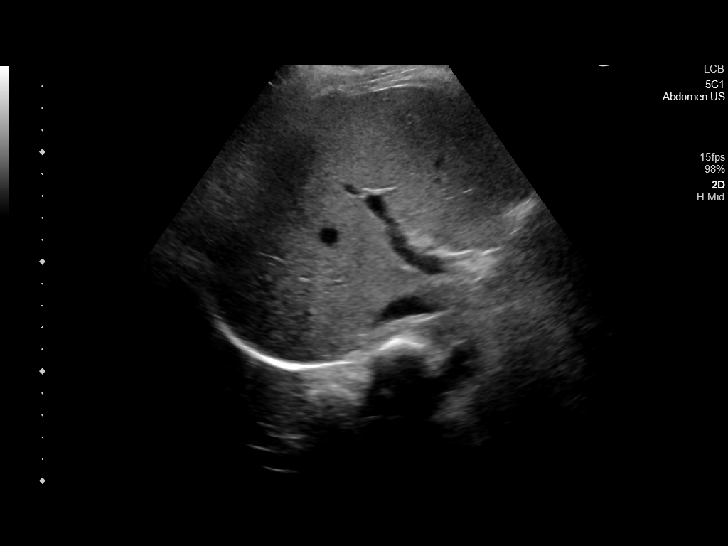
[im 42/42]
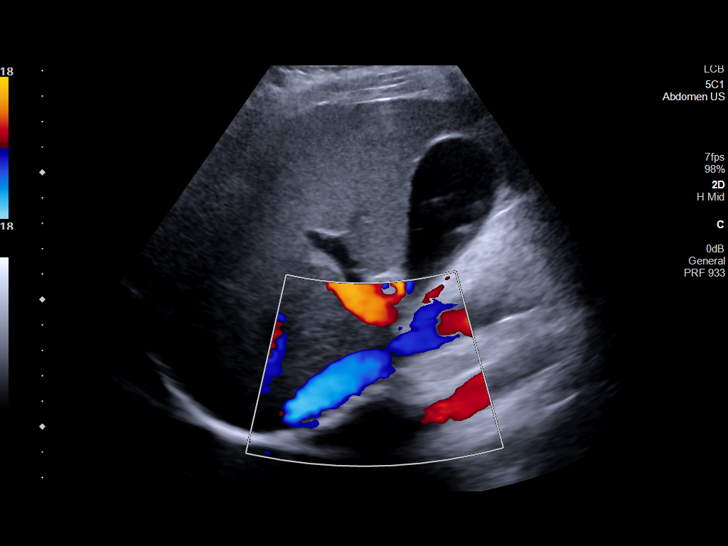

[14 of 25 positions shown; findings below may reference images not displayed]

FINDINGS: Gallbladder:

Multiple small less than 1 cm gallstones are seen, however
gallbladder is nondilated and there is no evidence of gallbladder
wall thickening. No sonographic Murphy sign noted by sonographer.

Common bile duct:

Diameter: 4 mm, within normal limits.

Liver:

No focal lesion identified. Within normal limits in parenchymal
echogenicity. Portal vein is patent on color Doppler imaging with
normal direction of blood flow towards the liver.

Other: None.
IMPRESSION: Cholelithiasis. No sonographic signs of cholecystitis or biliary
ductal dilatation.
